# Patient Record
Sex: Female | Born: 1970 | Race: White | Hispanic: No | Marital: Married | Smoking: Never smoker
Health system: Southern US, Community
[De-identification: ages and names within clinical notes are randomized; demographics above are authoritative.]

## PROBLEM LIST (undated history)

## (undated) DIAGNOSIS — R519 Headache, unspecified: Secondary | ICD-10-CM

## (undated) DIAGNOSIS — R51 Headache: Secondary | ICD-10-CM

## (undated) DIAGNOSIS — K219 Gastro-esophageal reflux disease without esophagitis: Secondary | ICD-10-CM

## (undated) DIAGNOSIS — R002 Palpitations: Secondary | ICD-10-CM

## (undated) DIAGNOSIS — Z8489 Family history of other specified conditions: Secondary | ICD-10-CM

## (undated) DIAGNOSIS — Z9889 Other specified postprocedural states: Secondary | ICD-10-CM

## (undated) DIAGNOSIS — F900 Attention-deficit hyperactivity disorder, predominantly inattentive type: Secondary | ICD-10-CM

## (undated) DIAGNOSIS — R112 Nausea with vomiting, unspecified: Secondary | ICD-10-CM

## (undated) HISTORY — PX: ESOPHAGOGASTRODUODENOSCOPY: SHX1529

## (undated) HISTORY — PX: TONSILLECTOMY: SUR1361

## (undated) HISTORY — PX: ABDOMINAL HYSTERECTOMY: SHX81

## (undated) HISTORY — PX: LASIK: SHX215

## (undated) HISTORY — PX: FOOT SURGERY: SHX648

---

## 2005-05-28 ENCOUNTER — Observation Stay: Payer: Self-pay | Admitting: Obstetrics and Gynecology

## 2005-06-16 ENCOUNTER — Ambulatory Visit: Payer: Self-pay | Admitting: Certified Nurse Midwife

## 2005-06-17 ENCOUNTER — Ambulatory Visit: Payer: Self-pay | Admitting: Obstetrics and Gynecology

## 2005-06-18 ENCOUNTER — Observation Stay: Payer: Self-pay

## 2005-08-07 ENCOUNTER — Inpatient Hospital Stay: Payer: Self-pay | Admitting: Obstetrics and Gynecology

## 2006-11-17 ENCOUNTER — Ambulatory Visit: Payer: Self-pay | Admitting: Obstetrics and Gynecology

## 2006-11-24 ENCOUNTER — Ambulatory Visit: Payer: Self-pay | Admitting: Obstetrics and Gynecology

## 2006-12-06 HISTORY — PX: NASAL SINUS SURGERY: SHX719

## 2007-05-09 ENCOUNTER — Ambulatory Visit: Payer: Self-pay | Admitting: Otolaryngology

## 2007-05-09 ENCOUNTER — Other Ambulatory Visit: Payer: Self-pay

## 2007-05-18 ENCOUNTER — Ambulatory Visit: Payer: Self-pay | Admitting: Otolaryngology

## 2007-05-29 ENCOUNTER — Ambulatory Visit: Payer: Self-pay | Admitting: Obstetrics and Gynecology

## 2010-08-05 ENCOUNTER — Ambulatory Visit: Payer: Self-pay | Admitting: Otolaryngology

## 2011-12-07 HISTORY — PX: BREAST BIOPSY: SHX20

## 2011-12-09 ENCOUNTER — Ambulatory Visit: Payer: Self-pay | Admitting: Obstetrics and Gynecology

## 2011-12-20 ENCOUNTER — Ambulatory Visit: Payer: Self-pay | Admitting: Obstetrics and Gynecology

## 2012-01-18 ENCOUNTER — Ambulatory Visit: Payer: Self-pay | Admitting: Surgery

## 2012-01-20 LAB — PATHOLOGY REPORT

## 2012-03-20 ENCOUNTER — Ambulatory Visit: Payer: Self-pay

## 2013-01-10 ENCOUNTER — Ambulatory Visit: Payer: Self-pay | Admitting: Obstetrics and Gynecology

## 2014-01-15 ENCOUNTER — Ambulatory Visit: Payer: Self-pay | Admitting: Obstetrics and Gynecology

## 2014-01-25 ENCOUNTER — Ambulatory Visit: Payer: Self-pay | Admitting: Gastroenterology

## 2014-02-10 ENCOUNTER — Ambulatory Visit: Payer: Self-pay | Admitting: Internal Medicine

## 2014-02-10 LAB — RAPID STREP-A WITH REFLX: MICRO TEXT REPORT: NEGATIVE

## 2014-02-12 LAB — BETA STREP CULTURE(ARMC)

## 2015-01-24 ENCOUNTER — Ambulatory Visit: Payer: Self-pay | Admitting: Obstetrics and Gynecology

## 2015-08-08 IMAGING — NM NUCLEAR MEDICINE HEPATOHBILIARY INCLUDE GB
2 series · 15 of 15 positions shown · non-contrast
Comparison: No comparisons

RADIOPHARMACEUTICALS:  8.25 5TiVc-HHm Choletec

CLINICAL DATA: Right upper quadrant abdominal pain, nausea,
diarrhea

EXAM:
NUCLEAR MEDICINE HEPATOBILIARY IMAGING WITH GALLBLADDER EF
TECHNIQUE: Sequential images of the abdomen were obtained [DATE] minutes
following intravenous administration of radiopharmaceutical. After
slow intravenous infusion of 1.39 micrograms Cholecystokinin,
gallbladder ejection fraction was determined.

[Series 1000: gallbladder ef · 4.80mm/px · 6 of 120 frames shown]
[frame 11/120]
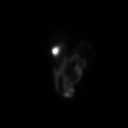
[frame 31/120]
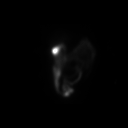
[frame 51/120]
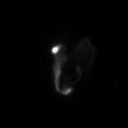
[frame 71/120]
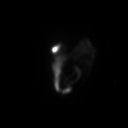
[frame 91/120]
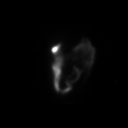
[frame 111/120]
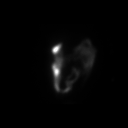

[Series 1000: gallbladder statics · 4.80mm/px · 9 of 9 slices shown]
[im 1/9]
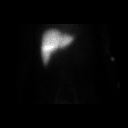
[im 2/9]
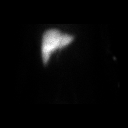
[im 3/9]
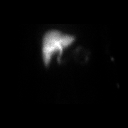
[im 4/9]
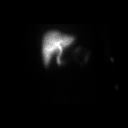
[im 5/9]
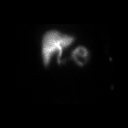
[im 6/9]
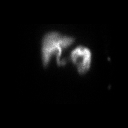
[im 7/9]
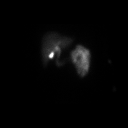
[im 8/9]
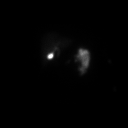
[im 9/9]
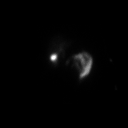

[15 of 15 positions shown; findings below may reference images not displayed]

FINDINGS: There is homogeneous distribution of injected radiotracer throughout
the hepatic parenchyma. There is early filling of the gallbladder,
initially seen on the 20 min anterior projection planar image.

There is early excretion of radiotracer with opacification of the
proximal small bowel, initially seen on the 10 min anterior
projection plantar image.

There is borderline delayed emptying of the gallbladder with
estimated gallbladder ejection fraction of only 36%. (Normal
gallbladder ejection fraction is greater than 35%).

The patient did not experience symptoms during CCK infusion.
IMPRESSION: 1. No scintigraphic evidence of acute cholecystitis.
2. Indeterminate findings for biliary dyskinesia with borderline
decreased gallbladder ejection fraction though the patient did not
experience abdominal pain during the CCK administration.

## 2016-01-16 ENCOUNTER — Other Ambulatory Visit: Payer: Self-pay | Admitting: Obstetrics and Gynecology

## 2016-01-16 DIAGNOSIS — Z1231 Encounter for screening mammogram for malignant neoplasm of breast: Secondary | ICD-10-CM

## 2016-01-16 DIAGNOSIS — F909 Attention-deficit hyperactivity disorder, unspecified type: Secondary | ICD-10-CM | POA: Insufficient documentation

## 2016-01-27 ENCOUNTER — Ambulatory Visit
Admission: RE | Admit: 2016-01-27 | Discharge: 2016-01-27 | Disposition: A | Discharge status: Home / Self Care | Payer: BC Managed Care – PPO | Source: Ambulatory Visit | Attending: Obstetrics and Gynecology | Admitting: Obstetrics and Gynecology

## 2016-01-27 DIAGNOSIS — Z1231 Encounter for screening mammogram for malignant neoplasm of breast: Secondary | ICD-10-CM | POA: Insufficient documentation

## 2016-01-29 DIAGNOSIS — R002 Palpitations: Secondary | ICD-10-CM | POA: Insufficient documentation

## 2016-01-29 DIAGNOSIS — R42 Dizziness and giddiness: Secondary | ICD-10-CM | POA: Insufficient documentation

## 2016-12-31 ENCOUNTER — Other Ambulatory Visit: Payer: Self-pay | Admitting: Obstetrics and Gynecology

## 2016-12-31 DIAGNOSIS — Z1231 Encounter for screening mammogram for malignant neoplasm of breast: Secondary | ICD-10-CM

## 2017-01-31 ENCOUNTER — Ambulatory Visit
Admission: RE | Admit: 2017-01-31 | Discharge: 2017-01-31 | Disposition: A | Discharge status: Home / Self Care | Payer: BC Managed Care – PPO | Source: Ambulatory Visit | Attending: Obstetrics and Gynecology | Admitting: Obstetrics and Gynecology

## 2017-01-31 DIAGNOSIS — Z1231 Encounter for screening mammogram for malignant neoplasm of breast: Secondary | ICD-10-CM | POA: Diagnosis not present

## 2017-02-14 ENCOUNTER — Other Ambulatory Visit: Payer: Self-pay | Admitting: Internal Medicine

## 2017-02-14 DIAGNOSIS — R1084 Generalized abdominal pain: Secondary | ICD-10-CM

## 2017-03-28 ENCOUNTER — Ambulatory Visit: Payer: BC Managed Care – PPO

## 2017-03-29 ENCOUNTER — Ambulatory Visit
Admission: RE | Admit: 2017-03-29 | Discharge: 2017-03-29 | Disposition: A | Discharge status: Home / Self Care | Payer: BC Managed Care – PPO | Source: Ambulatory Visit | Attending: Internal Medicine | Admitting: Internal Medicine

## 2017-03-29 DIAGNOSIS — R1084 Generalized abdominal pain: Secondary | ICD-10-CM | POA: Diagnosis present

## 2017-03-30 ENCOUNTER — Other Ambulatory Visit: Payer: Self-pay | Admitting: Internal Medicine

## 2017-03-30 DIAGNOSIS — R1011 Right upper quadrant pain: Secondary | ICD-10-CM

## 2017-04-12 ENCOUNTER — Ambulatory Visit
Admission: RE | Admit: 2017-04-12 | Discharge: 2017-04-12 | Disposition: A | Discharge status: Home / Self Care | Payer: BC Managed Care – PPO | Source: Ambulatory Visit | Attending: Internal Medicine | Admitting: Internal Medicine

## 2017-04-12 DIAGNOSIS — R1011 Right upper quadrant pain: Secondary | ICD-10-CM | POA: Insufficient documentation

## 2017-04-12 MED ORDER — TECHNETIUM TC 99M MEBROFENIN IV KIT
4.9100 | PACK | Freq: Once | INTRAVENOUS | Status: AC | PRN
  Administered 2017-04-12: 4.91 via INTRAVENOUS

## 2017-05-31 NOTE — H&P (Addendum)
HPI: AUB- Fibroids  Unresponsive to IUD and OCPs- i removed IUD in 2/17 for pain and cramping-vasectomy.  - Planned SIS aborted, because of thin stripe with hx of vasovagal sx with IUD placement and TVUS with fibroids.  Fibroids appear to be submucosal- no ablation (would need to be in OR, because so uncomfortable)  Ut anteverted, measuring 8.5x6x6cm  Fibroids seen: 1) post 39mm 2) mid 30mm  LOV simple cyst=2.34cm  ROV appears wnl ES 69mm  EMBx: ENDOMETRIUM, BIOPSY:  INACTIVE ENDOMETRIUM. NO HYPERPLASIA OR CARCINOMA.  Pap 2/16:  DIAGNOSIS: - [facility] Comment   Final  NEGATIVE FOR INTRAEPITHELIAL LESION AND MALIGNANCY.    Past Medical History:  has a past medical history of Cervicalgia; Headache; History of chicken pox; Lumbago; and Rotator cuff syndrome.  Past Surgical History:  has a past surgical history that includes Laser eye surgery (2008); Sinus surgery (2008); Foot surgery (2001); Tonsillectomy (1990); and Wisdom teeth removal (1989). Family History: family history includes Breast cancer in her maternal grandmother; Epilepsy in her mother; High blood pressure (Hypertension) in her mother; Irritable bowel syndrome in her mother; Lung cancer in her father. Social History:  reports that she has never smoked. She has never used smokeless tobacco. She reports that she drinks alcohol. She reports that she does not use drugs. OB/GYN History:          OB History    Gravida Para Term Preterm AB Living   3 2 1 1 1 2    SAB TAB Ectopic Molar Multiple Live Births     1       2      Obstetric Comments   G2 died of SIDS in 10-16-04 - pt has one living child      Allergies: is allergic to ceftin [cefuroxime axetil]; sulfa (sulfonamide antibiotics); and tetracycline. Medications:  Current Outpatient Prescriptions:  .  dextroamphetamine-amphetamine (ADDERALL) 10 mg tablet, Take 10 mg by mouth 2 (two) times daily., Disp: , Rfl:  .   MULTIVITAMIN ORAL, Take by mouth., Disp: , Rfl:  .  norethindrone-e.estradiol-iron (LO LOESTRIN FE) 1 mg-10 mcg (24)/10 mcg (2), Take 1 tablet by mouth once daily., Disp: 28 tablet, Rfl: 0 .  GLUCOSAMINE SULFATE (GLUCOSAMINE ORAL), Take by mouth., Disp: , Rfl:  .  LACTOBACILLUS ACIDOPHILUS (PROBIOTIC ORAL), Take by mouth., Disp: , Rfl:  .  mometasone (NASONEX) 50 mcg/actuation nasal spray, Place 2 sprays into both nostrils once daily as needed., Disp: , Rfl:   Review of Systems: No SOB, no palpitations or chest pain, no new lower extremity edema, no nausea or vomiting or bowel or bladder complaints. See HPI for gyn specific ROS.   Exam:   BP 123/82   Pulse 99   Ht 162.6 cm (5\' 4" )   Wt 75.3 kg (166 lb)   LMP 04/26/2017 (Approximate)   BMI 28.49 kg/m   General: Patient is well-groomed, well-nourished, appears stated age in no acute distress  HEENT: head is atraumatic and normocephalic, trachea is midline, neck is supple with no palpable nodules  CV: Regular rhythm and normal heart rate, no murmur  Pulm: Clear to auscultation throughout lung fields with no wheezing, crackles, or rhonchi. No increased work of breathing  Abdomen: soft , no mass, non-tender, no rebound tenderness, no hepatomegaly  Pelvic: deferred today   Impression:   The primary encounter diagnosis was Excessive or frequent menstruation. A diagnosis of Intramural and submucous leiomyoma of uterus was also pertinent to this visit.    Plan:  Patient returns for a preoperative discussion regarding her plans to proceed with definitive surgical treatment of her fibroid uterus causing AUB by  total vaginal hysterectomy with bilateral salpingectomy, and possible cystoscopy.  She has rash/hive allergy to sulfa, PCN and the cyclines. We will trial gent/clinda for ppx abx.  The patient and I discussed the technical aspects of the procedure including the potential for risks and complications. These  include but are not limited to the risk of infection requiring post-operative antibiotics or further procedures. We talked about the risk of injury to adjacent organs including bladder, bowel, ureter, blood vessels or nerves. We talked about the need to convert to a laparoscopy or an open incision. We talked about the possible need for blood transfusion. We talked aboutpostop complications such asthromboembolic or cardiopulmonary complications. All of her questions were answered.  Her preoperative exam was completed and the appropriate consents were signed. She is scheduled to undergo this procedure in the near future.

## 2017-06-01 ENCOUNTER — Other Ambulatory Visit: Payer: BC Managed Care – PPO

## 2017-06-02 ENCOUNTER — Encounter
Admission: RE | Admit: 2017-06-02 | Discharge: 2017-06-02 | Disposition: A | Discharge status: Home / Self Care | Payer: BC Managed Care – PPO | Source: Ambulatory Visit | Attending: Obstetrics and Gynecology | Admitting: Obstetrics and Gynecology

## 2017-06-02 HISTORY — DX: Headache, unspecified: R51.9

## 2017-06-02 HISTORY — DX: Palpitations: R00.2

## 2017-06-02 HISTORY — DX: Headache: R51

## 2017-06-02 HISTORY — DX: Gastro-esophageal reflux disease without esophagitis: K21.9

## 2017-06-02 NOTE — Pre-Procedure Instructions (Signed)
Progress Notes - in this encounter  Flossie Dibble, MD - 01/29/2016 11:30 AM EST Formatting of this note may be different from the original. New Patient Visit   Chief Complaint: Chief Complaint  Patient presents with  . New Consultation  palpitations  . Dizziness  . Palpitations  Date of Service: 01/29/2016 Date of Birth: 05-Jan-1971 PCP: Idelle Crouch, MD, MD  History of Present Illness: Ms. Magos is a 46 y.o.female patient Palpitations The patient has a new problem of palpitations over the last 4 days worsening with increased frequency occurring intermittently associated with dizziness. They may be relived by nothing. Possible causes include Probably benign palpitations, not indicative of a serious arrhythmia  Dizziness The patient has had Acute dizziness over the last 5 days associated with moving from lying to sitting position and moving from sitting to standing position with variable relief. These symptoms appear to be worsening with increased frequency. Other symptoms include dimming visionLightheaded. Possible causes include unknown  Stress Test Regular Stress was performed showing Normal test.  Past Medical and Surgical History  Past Medical History Past Medical History  Diagnosis Date  . Cervicalgia  . Headache  . History of chicken pox  . Lumbago  . Rotator cuff syndrome   Past Surgical History She has a past surgical history that includes Laser eye surgery (2008); Sinus surgery (2008); Foot surgery (2001); Tonsillectomy (1990); and Wisdom teeth removal (1989).   Medications and Allergies  Current Medications  Current Outpatient Prescriptions  Medication Sig Dispense Refill  . [START ON 03/15/2016] dextroamphetamine-amphetamine (ADDERALL) 10 mg tablet Take 1 tablet (10 mg total) by mouth 2 (two) times daily. Earliest Fill Date: 03/15/16 60 tablet 0  . GLUCOSAMINE SULFATE (GLUCOSAMINE ORAL) Take by mouth.  Marland Kitchen LACTOBACILLUS ACIDOPHILUS (PROBIOTIC ORAL) Take by  mouth.  . mometasone (NASONEX) 50 mcg/actuation nasal spray Place 2 sprays into both nostrils once daily as needed.  . MULTIVITAMIN ORAL Take by mouth.   No current facility-administered medications for this visit.   Allergies: Ceftin [cefuroxime axetil]; Sulfa (sulfonamide antibiotics); and Tetracycline  Social and Family History  Social History reports that she has never smoked. She has never used smokeless tobacco. She reports that she drinks alcohol. She reports that she does not use illicit drugs.  Family History Family History  Problem Relation Age of Onset  . Irritable bowel syndrome Mother  did need her gallbladder removed  . Hypertension Mother  . Epilepsy Mother  . Lung cancer Father  . Breast cancer Maternal Grandmother   Review of Systems  Positive for palps dizziness Review of Systems:negative for weight gain, wieght loss, weakness, fatigue, vision change, hearing loss, cough, congestion, PND, orthopnea, heartburn, nausea, vomiting, diarrhea, bloody stools, melena, stomach pain, leg weakness, leg pain, leg blood clots leg cramping, headache, blackouts, nosebleed, trouble swallowing, frequent urination, urination at night, skin rashes, skin lesions, muscle weakness, numbness, tingling, anxiety, depression  Physical Examination   Vitals: Visit Vitals  . BP 122/78  . Pulse 91  . Resp 14  . Ht 165.1 cm (5\' 5" )  . Wt 71.7 kg (158 lb)  . BMI 26.29 kg/m2   Ht:165.1 cm (5\' 5" ) Wt:71.7 kg (158 lb) OEV:OJJK surface area is 1.81 meters squared. Body mass index is 26.29 kg/(m^2). Appearance: well appearing in no acute distress HEENT: Pupils equally reactive to light and accomodation no apparent xantholasma or other apparent lesions  Neck: Supple without masses or lymphadenopathy Lungs: normal respiratory effort; no wheezes, no crackles, no rhonchi Heart: Regular  rate and rhythm. Normal S1 S2 No gallops, murmurs, no rub, PMI is normal size and placement. carotid upstroke  normal without bruit. Jugular venous pressure is normal Abdomen: soft, nontender, non distended, with normal bowel sounds. Abdominal aorta is normal size without bruit. No apparent masses Extremities: No edema, no cyanosis, no clubbing, no ulcers Peripheral Pulses: 2+ in all extremities, 2+ femoral pulses bilaterally, 2+dp pulses Musculoskeletal; Normal muscle tone  Neurological: Cranial nerves intact, Oriented to time, place, and person  Assessment   46 y.o. female with  Encounter Diagnoses  Name Primary?  . Dizziness and giddiness Yes  . Palpitations   Plan  -No further intervention of preventricular contractions which appear to be benign in nature. Continue diet and exercise as well as treatment of lifestyle actions which may help above symptoms. Other consideration of medical management as needed if patient has significant symptoms in the future. -No further cardiac intervention at this time due to normal stress test without evidence of myocardial ischemia or chest pain at peak stress. Patient will watch for any recurrance of concerning symptoms and report them for further evaluation and treatment options. -The dizziness appears to be multifactorial in nature at this time and therefore we plan for further observation. No intervention is needed at this time. We will follow for other cardiovascular symptoms with continued ambulation  No orders of the defined types were placed in this encounter.  Return if symptoms worsen or fail to improve.  Flossie Dibble, MD    Plan of Treatment - as of this encounter   Upcoming Encounters Upcoming Encounters  Date Type Specialty Care Team Description  07/13/2017 Procedure visit Plastic Surgery Wyline Copas, MD  [address] [place], [place] [address]  [phone]  [phone] ([address]    Tish Men    08/09/2017 Initial consult Dermatology Frederick Peers, MD  [address]  [place], [place] [address]  [phone]  [phone] (Fax)     Visit Diagnoses    Diagnosis  Dizziness and giddiness - Primary  Palpitations   Discontinued Medications - as of this encounter   Prescription Sig. Discontinue Reason Start Date End Date  dextroamphetamine-amphetamine (ADDERALL) 10 mg tablet  Indications: Adult ADHD Take 1 tablet (10 mg total) by mouth 2 (two) times daily.  01/16/2016 01/29/2016  dextroamphetamine-amphetamine (ADDERALL) 10 mg tablet  Indications: Adult ADHD Take 1 tablet (10 mg total) by mouth 2 (two) times daily. Earliest Fill Date: 02/13/16  02/13/2016 01/29/2016   Images Document Information   Primary Care Provider Idelle Crouch MD (Feb. 10, 2017 - Present) [phone] (Work) [phone] (Fax) [facility] [place], [place] [address]  Document Coverage Dates Feb. 23, 2017  [place], [place] [address]   Encounter Providers Flossie Dibble MD (Attending) [phone] (Work) [phone] (Fax) [place] [facility] [place], [place] [address]   Encounter Date Feb. 23, 2017

## 2017-06-02 NOTE — Pre-Procedure Instructions (Signed)
ECG 12-lead3/09/2015 [place] Component Name Value Ref Range  Vent Rate (bpm) 73   PR Interval (msec) 134   QRS Interval (msec) 80   QT Interval (msec) 386   QTc (msec) 425   Result Narrative  Normal sinus rhythm Normal ECG No previous ECGs available I reviewed and concur with this report. Electronically signed RE:QJEADG MD, JEFFREY ([id number]) on 04/04/2015 8:04:50 AM  Status Results Details    Initial consult on 02/13/2015 [place]")' href="[email or web address]/">Encounter Summary

## 2017-06-02 NOTE — Patient Instructions (Signed)
  Your procedure is scheduled on: 06-20-17 MONDAY Report to Same Day Surgery 2nd floor medical mall [facility] Entrance-take elevator on left to 2nd floor.  Check in with surgery information desk.) To find out your arrival time please call [phone] between 1PM - 3PM on 06-17-17 FRIDAY  Remember: Instructions that are not followed completely may result in serious medical risk, up to and including death, or upon the discretion of your surgeon and anesthesiologist your surgery may need to be rescheduled.    _x___ 1. Do not eat food or drink liquids after midnight. No gum chewing or hard candies.     __x__ 2. No Alcohol for 24 hours before or after surgery.   __x__3. No Smoking for 24 prior to surgery.   ____  4. Bring all medications with you on the day of surgery if instructed.    __x__ 5. Notify your doctor if there is any change in your medical condition     (cold, fever, infections).     Do not wear jewelry, make-up, hairpins, clips or nail polish.  Do not wear lotions, powders, or perfumes. You may wear deodorant.  Do not shave 48 hours prior to surgery. Men may shave face and neck.  Do not bring valuables to the hospital.    [facility] is not responsible for any belongings or valuables.               Contacts, dentures or bridgework may not be worn into surgery.  Leave your suitcase in the car. After surgery it may be brought to your room.  For patients admitted to the hospital, discharge time is determined by your  treatment team.   Patients discharged the day of surgery will not be allowed to drive home.  You will need someone to drive you home and stay with you the night of your procedure.    Please read over the following fact sheets that you were given:     ____ Take anti-hypertensive (unless it includes a diuretic), cardiac, seizure, asthma,     anti-reflux and psychiatric medicines. These include:  1. NONE  2.  3.  4.  5.  6.  ____Fleets enema or Magnesium  Citrate as directed.   ____ Use CHG Soap or sage wipes as directed on instruction sheet   ____ Use inhalers on the day of surgery and bring to hospital day of surgery  ____ Stop Metformin and Janumet 2 days prior to surgery.    ____ Take 1/2 of usual insulin dose the night before surgery and none on the morning surgery.   ____ Follow recommendations from Cardiologist, Pulmonologist or PCP regarding stopping Aspirin, Coumadin, Pllavix ,Eliquis, Effient, or Pradaxa, and Pletal.  X____Stop Anti-inflammatories such as Advil, Aleve, Ibuprofen, Motrin, Naproxen, Naprosyn, Goodies powders or aspirin products NOW-OK to take Tylenol    ____ Stop supplements until after surgery.     ____ Bring C-Pap to the hospital.

## 2017-06-13 ENCOUNTER — Encounter
Admission: RE | Admit: 2017-06-13 | Discharge: 2017-06-13 | Disposition: A | Discharge status: Home / Self Care | Payer: BC Managed Care – PPO | Source: Ambulatory Visit | Attending: Obstetrics and Gynecology | Admitting: Obstetrics and Gynecology

## 2017-06-13 DIAGNOSIS — N939 Abnormal uterine and vaginal bleeding, unspecified: Secondary | ICD-10-CM | POA: Insufficient documentation

## 2017-06-13 DIAGNOSIS — Z01818 Encounter for other preprocedural examination: Secondary | ICD-10-CM | POA: Diagnosis present

## 2017-06-13 LAB — BASIC METABOLIC PANEL
Anion gap: 9 (ref 5–15)
BUN: 12 mg/dL (ref 6–20)
CHLORIDE: 107 mmol/L (ref 101–111)
CO2: 25 mmol/L (ref 22–32)
CREATININE: 0.68 mg/dL (ref 0.44–1.00)
Calcium: 8.9 mg/dL (ref 8.9–10.3)
GFR calc Af Amer: >60 mL/min (ref >60)
GFR calc non Af Amer: >60 mL/min (ref >60)
Glucose, Bld: 90 mg/dL (ref 65–99)
Potassium: 3.6 mmol/L (ref 3.5–5.1)
SODIUM: 141 mmol/L (ref 135–145)

## 2017-06-13 LAB — CBC
HCT: 40.9 % (ref 35.0–47.0)
Hemoglobin: 14 g/dL (ref 12.0–16.0)
MCH: 32 pg (ref 26.0–34.0)
MCHC: 34.2 g/dL (ref 32.0–36.0)
MCV: 93.4 fL (ref 80.0–100.0)
PLATELETS: 244 10*3/uL (ref 150–440)
RBC: 4.38 MIL/uL (ref 3.80–5.20)
RDW: 12.4 % (ref 11.5–14.5)
WBC: 8.4 10*3/uL (ref 3.6–11.0)

## 2017-06-13 LAB — TYPE AND SCREEN
ABO/RH(D): A POS
Antibody Screen: NEGATIVE

## 2017-06-19 MED ORDER — GENTAMICIN SULFATE 40 MG/ML IJ SOLN: INTRAMUSCULAR | Status: DC

## 2017-06-20 ENCOUNTER — Encounter
Admission: RE | Disposition: A | Discharge status: Home / Self Care | Payer: Self-pay | Source: Ambulatory Visit | Attending: Obstetrics and Gynecology

## 2017-06-20 ENCOUNTER — Encounter: Payer: Self-pay | Admitting: *Deleted

## 2017-06-20 ENCOUNTER — Ambulatory Visit
Admission: RE | Admit: 2017-06-20 | Discharge: 2017-06-20 | Disposition: A | Discharge status: Home / Self Care | Payer: BC Managed Care – PPO | Source: Ambulatory Visit | Attending: Obstetrics and Gynecology | Admitting: Obstetrics and Gynecology

## 2017-06-20 ENCOUNTER — Ambulatory Visit: Payer: BC Managed Care – PPO | Admitting: Anesthesiology

## 2017-06-20 DIAGNOSIS — D251 Intramural leiomyoma of uterus: Secondary | ICD-10-CM | POA: Insufficient documentation

## 2017-06-20 DIAGNOSIS — Z79899 Other long term (current) drug therapy: Secondary | ICD-10-CM | POA: Insufficient documentation

## 2017-06-20 DIAGNOSIS — N920 Excessive and frequent menstruation with regular cycle: Secondary | ICD-10-CM | POA: Diagnosis present

## 2017-06-20 DIAGNOSIS — N72 Inflammatory disease of cervix uteri: Secondary | ICD-10-CM | POA: Diagnosis present

## 2017-06-20 DIAGNOSIS — N838 Other noninflammatory disorders of ovary, fallopian tube and broad ligament: Secondary | ICD-10-CM | POA: Diagnosis not present

## 2017-06-20 DIAGNOSIS — K219 Gastro-esophageal reflux disease without esophagitis: Secondary | ICD-10-CM | POA: Diagnosis not present

## 2017-06-20 HISTORY — PX: VAGINAL HYSTERECTOMY: SHX2639

## 2017-06-20 HISTORY — PX: BILATERAL SALPINGECTOMY: SHX5743

## 2017-06-20 LAB — ABO/RH: ABO/RH(D): A POS

## 2017-06-20 LAB — POCT PREGNANCY, URINE: Preg Test, Ur: NEGATIVE

## 2017-06-20 SURGERY — HYSTERECTOMY, VAGINAL
Anesthesia: General | Wound class: Clean Contaminated

## 2017-06-20 MED ORDER — LIDOCAINE-EPINEPHRINE 1 %-1:100000 IJ SOLN
INTRAMUSCULAR | Status: AC
  Filled 2017-06-20: qty 1

## 2017-06-20 MED ORDER — PROPOFOL 10 MG/ML IV BOLUS
INTRAVENOUS | Status: DC | PRN
  Administered 2017-06-20: 150 mg via INTRAVENOUS

## 2017-06-20 MED ORDER — DEXAMETHASONE SODIUM PHOSPHATE 10 MG/ML IJ SOLN
INTRAMUSCULAR | Status: DC | PRN
  Administered 2017-06-20: 5 mg via INTRAVENOUS

## 2017-06-20 MED ORDER — OXYCODONE HCL 5 MG PO CAPS: 5.0000 mg | ORAL_CAPSULE | Freq: Four times a day (QID) | ORAL | 0 refills | Status: DC | PRN

## 2017-06-20 MED ORDER — PHENYLEPHRINE HCL 10 MG/ML IJ SOLN
INTRAMUSCULAR | Status: AC
  Filled 2017-06-20: qty 1

## 2017-06-20 MED ORDER — DOCUSATE SODIUM 100 MG PO CAPS: 100.0000 mg | ORAL_CAPSULE | Freq: Every day | ORAL | 0 refills | Status: DC | PRN

## 2017-06-20 MED ORDER — ACETAMINOPHEN 500 MG PO TABS
ORAL_TABLET | ORAL | Status: AC
  Filled 2017-06-20: qty 2

## 2017-06-20 MED ORDER — FENTANYL CITRATE (PF) 100 MCG/2ML IJ SOLN: 25.0000 ug | INTRAMUSCULAR | Status: DC | PRN

## 2017-06-20 MED ORDER — GABAPENTIN 300 MG PO CAPS
900.0000 mg | ORAL_CAPSULE | ORAL | Status: AC
  Administered 2017-06-20: 900 mg via ORAL

## 2017-06-20 MED ORDER — OXYCODONE HCL 5 MG/5ML PO SOLN: 5.0000 mg | Freq: Once | ORAL | Status: AC | PRN

## 2017-06-20 MED ORDER — MEPERIDINE HCL 50 MG/ML IJ SOLN: 6.2500 mg | INTRAMUSCULAR | Status: DC | PRN

## 2017-06-20 MED ORDER — ONDANSETRON 4 MG PO TBDP
4.0000 mg | ORAL_TABLET | Freq: Four times a day (QID) | ORAL | Status: DC | PRN
  Administered 2017-06-20: 4 mg via ORAL
  Filled 2017-06-20: qty 1

## 2017-06-20 MED ORDER — KETOROLAC TROMETHAMINE 30 MG/ML IJ SOLN
INTRAMUSCULAR | Status: AC
  Filled 2017-06-20: qty 1

## 2017-06-20 MED ORDER — LIDOCAINE HCL (CARDIAC) 20 MG/ML IV SOLN
INTRAVENOUS | Status: DC | PRN
  Administered 2017-06-20: 60 mg via INTRAVENOUS

## 2017-06-20 MED ORDER — PROPOFOL 10 MG/ML IV BOLUS
INTRAVENOUS | Status: AC
  Filled 2017-06-20: qty 20

## 2017-06-20 MED ORDER — LACTATED RINGERS IV SOLN: INTRAVENOUS | Status: DC

## 2017-06-20 MED ORDER — SUGAMMADEX SODIUM 200 MG/2ML IV SOLN
INTRAVENOUS | Status: DC | PRN
  Administered 2017-06-20: 150 mg via INTRAVENOUS

## 2017-06-20 MED ORDER — DEXAMETHASONE SODIUM PHOSPHATE 10 MG/ML IJ SOLN
INTRAMUSCULAR | Status: AC
  Filled 2017-06-20: qty 1

## 2017-06-20 MED ORDER — ONDANSETRON HCL 4 MG/2ML IJ SOLN
INTRAMUSCULAR | Status: DC | PRN
  Administered 2017-06-20: 4 mg via INTRAVENOUS

## 2017-06-20 MED ORDER — ROCURONIUM BROMIDE 50 MG/5ML IV SOLN
INTRAVENOUS | Status: AC
  Filled 2017-06-20: qty 2

## 2017-06-20 MED ORDER — ONDANSETRON 4 MG PO TBDP: 4.0000 mg | ORAL_TABLET | Freq: Four times a day (QID) | ORAL | 0 refills | Status: DC | PRN

## 2017-06-20 MED ORDER — EPHEDRINE SULFATE 50 MG/ML IJ SOLN
INTRAMUSCULAR | Status: DC | PRN
  Administered 2017-06-20: 5 mg via INTRAVENOUS
  Administered 2017-06-20: 10 mg via INTRAVENOUS

## 2017-06-20 MED ORDER — LACTATED RINGERS IV SOLN
INTRAVENOUS | Status: DC
  Administered 2017-06-20: 07:00:00 via INTRAVENOUS

## 2017-06-20 MED ORDER — OXYCODONE HCL 5 MG PO TABS
ORAL_TABLET | ORAL | Status: AC
  Filled 2017-06-20: qty 1

## 2017-06-20 MED ORDER — KETOROLAC TROMETHAMINE 30 MG/ML IJ SOLN
INTRAMUSCULAR | Status: DC | PRN
  Administered 2017-06-20: 30 mg via INTRAVENOUS

## 2017-06-20 MED ORDER — CLINDAMYCIN PHOSPHATE 900 MG/50ML IV SOLN
900.0000 mg | Freq: Once | INTRAVENOUS | Status: AC
  Administered 2017-06-20: 900 mg via INTRAVENOUS

## 2017-06-20 MED ORDER — IBUPROFEN 800 MG PO TABS: 800.0000 mg | ORAL_TABLET | Freq: Three times a day (TID) | ORAL | 1 refills | Status: DC | PRN

## 2017-06-20 MED ORDER — LIDOCAINE-EPINEPHRINE 1 %-1:100000 IJ SOLN
INTRAMUSCULAR | Status: DC | PRN
  Administered 2017-06-20: 19 mL

## 2017-06-20 MED ORDER — ACETAMINOPHEN 500 MG PO TABS
1000.0000 mg | ORAL_TABLET | ORAL | Status: AC
  Administered 2017-06-20: 1000 mg via ORAL

## 2017-06-20 MED ORDER — MIDAZOLAM HCL 2 MG/2ML IJ SOLN
INTRAMUSCULAR | Status: AC
  Filled 2017-06-20: qty 2

## 2017-06-20 MED ORDER — FAMOTIDINE 20 MG PO TABS: 20.0000 mg | ORAL_TABLET | Freq: Once | ORAL | Status: DC

## 2017-06-20 MED ORDER — FENTANYL CITRATE (PF) 100 MCG/2ML IJ SOLN
INTRAMUSCULAR | Status: DC | PRN
  Administered 2017-06-20: 100 ug via INTRAVENOUS
  Administered 2017-06-20: 25 ug via INTRAVENOUS

## 2017-06-20 MED ORDER — SUGAMMADEX SODIUM 200 MG/2ML IV SOLN
INTRAVENOUS | Status: AC
  Filled 2017-06-20: qty 2

## 2017-06-20 MED ORDER — PROMETHAZINE HCL 25 MG/ML IJ SOLN: 6.2500 mg | INTRAMUSCULAR | Status: DC | PRN

## 2017-06-20 MED ORDER — EPHEDRINE SULFATE 50 MG/ML IJ SOLN
INTRAMUSCULAR | Status: AC
Start: 2017-06-20 — End: 2017-06-20
  Filled 2017-06-20: qty 1

## 2017-06-20 MED ORDER — MIDAZOLAM HCL 2 MG/2ML IJ SOLN
INTRAMUSCULAR | Status: DC | PRN
  Administered 2017-06-20: 2 mg via INTRAVENOUS

## 2017-06-20 MED ORDER — GABAPENTIN 800 MG PO TABS: 800.0000 mg | ORAL_TABLET | Freq: Every day | ORAL | 0 refills | Status: DC

## 2017-06-20 MED ORDER — ONDANSETRON HCL 4 MG/2ML IJ SOLN
INTRAMUSCULAR | Status: AC
  Filled 2017-06-20: qty 2

## 2017-06-20 MED ORDER — ONDANSETRON HCL 4 MG PO TABS
ORAL_TABLET | ORAL | Status: AC
  Filled 2017-06-20: qty 1

## 2017-06-20 MED ORDER — FENTANYL CITRATE (PF) 250 MCG/5ML IJ SOLN
INTRAMUSCULAR | Status: AC
  Filled 2017-06-20: qty 5

## 2017-06-20 MED ORDER — GABAPENTIN 300 MG PO CAPS
ORAL_CAPSULE | ORAL | Status: AC
  Filled 2017-06-20: qty 3

## 2017-06-20 MED ORDER — CLINDAMYCIN PHOSPHATE 900 MG/50ML IV SOLN
INTRAVENOUS | Status: AC
  Filled 2017-06-20: qty 50

## 2017-06-20 MED ORDER — LIDOCAINE HCL (PF) 2 % IJ SOLN
INTRAMUSCULAR | Status: AC
  Filled 2017-06-20: qty 2

## 2017-06-20 MED ORDER — GENTAMICIN SULFATE 40 MG/ML IJ SOLN
5.0000 mg/kg | Freq: Once | INTRAVENOUS | Status: AC
  Administered 2017-06-20: 310 mg via INTRAVENOUS
  Filled 2017-06-20: qty 7.75

## 2017-06-20 MED ORDER — ROCURONIUM BROMIDE 100 MG/10ML IV SOLN
INTRAVENOUS | Status: DC | PRN
  Administered 2017-06-20: 50 mg via INTRAVENOUS
  Administered 2017-06-20: 10 mg via INTRAVENOUS

## 2017-06-20 MED ORDER — OXYCODONE HCL 5 MG PO TABS
5.0000 mg | ORAL_TABLET | Freq: Once | ORAL | Status: AC | PRN
  Administered 2017-06-20: 5 mg via ORAL

## 2017-06-20 MED ORDER — ACETAMINOPHEN 500 MG PO TABS: 1000.0000 mg | ORAL_TABLET | Freq: Four times a day (QID) | ORAL | 0 refills | Status: AC

## 2017-06-20 SURGICAL SUPPLY — 30 items
BAG URO DRAIN 2000ML W/SPOUT (MISCELLANEOUS) ×4 IMPLANT
CANISTER SUCT 1200ML W/VALVE (MISCELLANEOUS) ×4 IMPLANT
CATH FOLEY 2WAY  5CC 16FR (CATHETERS) ×2
CATH URTH 16FR FL 2W BLN LF (CATHETERS) ×2 IMPLANT
DRAPE PERI LITHO V/GYN (MISCELLANEOUS) ×4 IMPLANT
DRAPE SURG 17X11 SM STRL (DRAPES) ×4 IMPLANT
DRAPE UNDER BUTTOCK W/FLU (DRAPES) ×4 IMPLANT
ELECT REM PT RETURN 9FT ADLT (ELECTROSURGICAL) ×4
ELECTRODE REM PT RTRN 9FT ADLT (ELECTROSURGICAL) ×2 IMPLANT
GLOVE BIO SURGEON STRL SZ7 (GLOVE) ×4 IMPLANT
GLOVE INDICATOR 7.5 STRL GRN (GLOVE) ×4 IMPLANT
GOWN STRL REUS W/ TWL LRG LVL3 (GOWN DISPOSABLE) ×6 IMPLANT
GOWN STRL REUS W/ TWL XL LVL3 (GOWN DISPOSABLE) ×2 IMPLANT
GOWN STRL REUS W/TWL LRG LVL3 (GOWN DISPOSABLE) ×6
GOWN STRL REUS W/TWL XL LVL3 (GOWN DISPOSABLE) ×2
KIT RM TURNOVER CYSTO AR (KITS) ×4 IMPLANT
LABEL OR SOLS (LABEL) ×4 IMPLANT
NDL SAFETY 22GX1.5 (NEEDLE) ×4 IMPLANT
PACK BASIN MINOR ARMC (MISCELLANEOUS) ×4 IMPLANT
PAD OB MATERNITY 4.3X12.25 (PERSONAL CARE ITEMS) ×4 IMPLANT
PAD PREP 24X41 OB/GYN DISP (PERSONAL CARE ITEMS) ×4 IMPLANT
SUT PDS 2-0 27IN (SUTURE) ×4 IMPLANT
SUT VIC AB 0 CT1 27 (SUTURE) ×6
SUT VIC AB 0 CT1 27XCR 8 STRN (SUTURE) ×6 IMPLANT
SUT VIC AB 0 CT1 36 (SUTURE) ×8 IMPLANT
SUT VIC AB 2-0 SH 27 (SUTURE) ×6
SUT VIC AB 2-0 SH 27XBRD (SUTURE) ×6 IMPLANT
SYR CONTROL 10ML (SYRINGE) ×4 IMPLANT
SYRINGE 10CC LL (SYRINGE) ×4 IMPLANT
WATER STERILE IRR 1000ML POUR (IV SOLUTION) ×4 IMPLANT

## 2017-06-20 NOTE — Discharge Instructions (Signed)
Discharge instructions after  vaginal hysterectomy  Signs and Symptoms to Report Call our office at [phone] if you have any of the following.   Fever over 100.4 degrees or higher  Severe stomach pain not relieved with pain medications  Bright red bleeding thats heavier than a period that does not slow with rest  To go the bathroom a lot (frequency), you cant hold your urine (urgency), or it hurts when you empty your bladder (urinate)  Chest pain  Shortness of breath  Pain in the calves of your legs  Severe nausea and vomiting not relieved with anti-nausea medications  Signs of infection around your wounds, such as redness, hot to touch, swelling, green/yellow drainage (like pus), bad smelling discharge  Any concerns  What You Can Expect after Surgery  You may see some pink tinged, bloody fluid and bruising around the wound. This is normal.  You may have a sore throat because of the tube in your mouth during general anesthesia. This will go away in 2 to 3 days.  You may have some stomach cramps.  You may notice spotting on your panties.   Activities after Your Discharge Follow these guidelines to help speed your recovery at home:  Do the coughing and deep breathing as you did in the hospital for 2 weeks. Use the small blue breathing device, called the incentive spirometer for 2 weeks.  Dont drive if you are in pain or taking narcotic pain medicine. You may drive when you can safely slam on the brakes, turn the wheel forcefully, and rotate your torso comfortably. This is typically 1-2 weeks. Practice in a parking lot or side street prior to attempting to drive regularly.   Ask others to help with household chores for 4 weeks.  Do not lift anything heavier that 10 pounds for 4-6 weeks. This includes pets, children, and groceries.  Dont do strenuous activities, exercises, or sports like vacuuming, tennis, squash, etc. until your doctor says it is safe to do  so. ---Maintain pelvic rest for 8 weeks. This means nothing in the vagina at all (no douching, tampons, intercourse) for 8 weeks.   Walk as you feel able. Rest often since it may take two or three weeks for your energy level to return to normal.   You may climb stairs  Avoid constipation:   -Eat fruits, vegetables, and whole grains. Eat small meals as your appetite will take time to return to normal.   -Drink 6 to 8 glasses of water each day.   -Use a laxative or stool softener as needed if constipation becomes a problem. You may take Miralax, metamucil, Citrucil, Colace, Senekot, FiberCon, etc. If this does not relieve the constipation, try two tablespoons of Milk Of Magnesia every 8 hours until your bowels move.   You may shower. Gently wash the wounds with a mild soap and water. Pat dry.  Do not get in a hot tub, swimming pool, etc. for 6 weeks.  Do not use lotions, oils, powders on the wounds.  Do not douche, use tampons, or have sex until your doctor says it is okay.  Take your pain medicine when you need it. The medicine may not work as well if the pain is bad.  Take the medicines you were taking before surgery. Other medications you will need are pain medications (Norco or Percocet) and nausea medications (Zofran).  General Anesthesia, Adult, Care After These instructions provide you with information about caring for yourself after your procedure. Your  health care provider may also give you more specific instructions. Your treatment has been planned according to current medical practices, but problems sometimes occur. Call your health care provider if you have any problems or questions after your procedure. What can I expect after the procedure? After the procedure, it is common to have:  Vomiting.  A sore throat.  Mental slowness.  It is common to feel:  Nauseous.  Cold or shivery.  Sleepy.  Tired.  Sore or achy, even in parts of your body where you did not have  surgery.  Follow these instructions at home: For at least 24 hours after the procedure:  Do not: ? Participate in activities where you could fall or become injured. ? Drive. ? Use heavy machinery. ? Drink alcohol. ? Take sleeping pills or medicines that cause drowsiness. ? Make important decisions or sign legal documents. ? Take care of children on your own.  Rest. Eating and drinking  If you vomit, drink water, juice, or soup when you can drink without vomiting.  Drink enough fluid to keep your urine clear or pale yellow.  Make sure you have little or no nausea before eating solid foods.  Follow the diet recommended by your health care provider. General instructions  Have a responsible adult stay with you until you are awake and alert.  Return to your normal activities as told by your health care provider. Ask your health care provider what activities are safe for you.  Take over-the-counter and prescription medicines only as told by your health care provider.  If you smoke, do not smoke without supervision.  Keep all follow-up visits as told by your health care provider. This is important. Contact a health care provider if:  You continue to have nausea or vomiting at home, and medicines are not helpful.  You cannot drink fluids or start eating again.  You cannot urinate after 8-12 hours.  You develop a skin rash.  You have fever.  You have increasing redness at the site of your procedure. Get help right away if:  You have difficulty breathing.  You have chest pain.  You have unexpected bleeding.  You feel that you are having a life-threatening or urgent problem. This information is not intended to replace advice given to you by your health care provider. Make sure you discuss any questions you have with your health care provider. Document Released: 02/28/2001 Document Revised: 04/26/2016 Document Reviewed: 11/06/2015 Elsevier Interactive Patient Education   [employer].

## 2017-06-20 NOTE — Addendum Note (Signed)
Addendum  created 06/20/17 1034 by Letitia Neri, CRNA   Anesthesia Intra Flowsheets edited

## 2017-06-20 NOTE — Progress Notes (Signed)
Patient wanted it made known she had a massage And had the cupping done then also, so she has Some red, strawberry marks on her back and wanted To make [place] aware of this.  Dr. Leafy Ro also aware.

## 2017-06-20 NOTE — Anesthesia Postprocedure Evaluation (Signed)
Anesthesia Post Note  Patient: Hannah Robbins  Procedure(s) Performed: Procedure(s) (LRB): HYSTERECTOMY VAGINAL (N/A) BILATERAL SALPINGECTOMY (Bilateral)  Patient location during evaluation: PACU Anesthesia Type: General Level of consciousness: awake and alert and oriented Pain management: pain level controlled Vital Signs Assessment: post-procedure vital signs reviewed and stable Respiratory status: spontaneous breathing, nonlabored ventilation and respiratory function stable Cardiovascular status: blood pressure returned to baseline and stable Postop Assessment: no signs of nausea or vomiting Anesthetic complications: no     Last Vitals:  Vitals:   06/20/17 1008 06/20/17 1017  BP: 111/64 (!) 112/55  Pulse: 87 75  Resp: 17 12  Temp: 37 C 36.8 C    Last Pain:  Vitals:   06/20/17 1017  TempSrc: Temporal  PainSc: 1                  D'Arcy Abraha
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
## 2017-06-20 NOTE — Interval H&P Note (Signed)
History and Physical Interval Note:  06/20/2017 7:42 AM  Hannah Robbins  has presented today for surgery, with the diagnosis of AUB  Fibroids  The various methods of treatment have been discussed with the patient and family. After consideration of risks, benefits and other options for treatment, the patient has consented to  Procedure(s): HYSTERECTOMY VAGINAL (N/A) BILATERAL SALPINGECTOMY (Bilateral) as a surgical intervention .  The patient's history has been reviewed, patient examined, no change in status, stable for surgery.  I have reviewed the patient's chart and labs.  Questions were answered to the patient's satisfaction.     Benjaman Kindler

## 2017-06-20 NOTE — Anesthesia Preprocedure Evaluation (Signed)
Anesthesia Evaluation  Patient identified by MRN, date of birth, ID band Patient awake    Reviewed: Allergy & Precautions, NPO status , Patient's Chart, lab work & pertinent test results  History of Anesthesia Complications Negative for: history of anesthetic complications  Airway Mallampati: I  TM Distance: >3 FB Neck ROM: Full    Dental no notable dental hx.    Pulmonary neg pulmonary ROS, neg sleep apnea, neg COPD,    breath sounds clear to auscultation- rhonchi (-) wheezing      Cardiovascular Exercise Tolerance: Good (-) hypertension(-) CAD, (-) Past MI and (-) Cardiac Stents  Rhythm:Regular Rate:Normal - Systolic murmurs and - Diastolic murmurs    Neuro/Psych  Headaches, negative psych ROS   GI/Hepatic Neg liver ROS, GERD  ,  Endo/Other  negative endocrine ROSneg diabetes  Renal/GU negative Renal ROS     Musculoskeletal negative musculoskeletal ROS (+)   Abdominal (+) - obese,   Peds  Hematology negative hematology ROS (+)   Anesthesia Other Findings Past Medical History: No date: GERD (gastroesophageal reflux disease)     Comment:  NO MEDS No date: Headache No date: Palpitations     Comment:  PT SAW DR Nehemiah Massed IN 2017 AND STRESS TEST WAS WNL-HE               DID NOT REQUIRE HER TO F/U DUE TO BEING BENIGN -PT STILL               WILL HAVE PALPITATIONS BUT IS ASYMPTOMATIC   Reproductive/Obstetrics                             Anesthesia Physical Anesthesia Plan  ASA: II  Anesthesia Plan: General   Post-op Pain Management:    Induction: Intravenous  PONV Risk Score and Plan: 2 and Ondansetron and Dexamethasone  Airway Management Planned: Oral ETT  Additional Equipment:   Intra-op Plan:   Post-operative Plan: Extubation in OR  Informed Consent: I have reviewed the patients History and Physical, chart, labs and discussed the procedure including the risks, benefits and  alternatives for the proposed anesthesia with the patient or authorized representative who has indicated his/her understanding and acceptance.   Dental advisory given  Plan Discussed with: CRNA and Anesthesiologist  Anesthesia Plan Comments:         Anesthesia Quick Evaluation

## 2017-06-20 NOTE — Transfer of Care (Signed)
Immediate Anesthesia Transfer of Care Note  Patient: Hannah Robbins  Procedure(s) Performed: Procedure(s): HYSTERECTOMY VAGINAL (N/A) BILATERAL SALPINGECTOMY (Bilateral)  Patient Location: PACU  Anesthesia Type:General  Level of Consciousness: sedated  Airway & Oxygen Therapy: Patient Spontanous Breathing  Post-op Assessment: Report given to RN and Post -op Vital signs reviewed and stable  Post vital signs: Reviewed and stable  Last Vitals:  Vitals:   06/20/17 0630  BP: 133/77  Pulse: 84  Resp: 18  Temp: 37.2 C    Last Pain:  Vitals:   06/20/17 0630  TempSrc: Tympanic  PainSc: 0-No pain         Complications: No apparent anesthesia complications

## 2017-06-20 NOTE — Progress Notes (Signed)
Assisted up to the bedside commode, urinated and then back to the recliner chair. Felt hot and a little nauseated with the movement. Reclined back in the chair for another hour and felt better after eating more crackers, drinking more fluids. Got dressed and went to get into the wheelchair to go home and felt a little nauseated and hot again. Assisted back to a reclining position in the recliner. Given zofran, provided quiet and lights off. Husband and son left to get lunch while patient takes a nap.

## 2017-06-20 NOTE — Addendum Note (Signed)
Addendum  created 06/20/17 1035 by Letitia Neri, CRNA   Anesthesia Intra Flowsheets edited

## 2017-06-20 NOTE — Anesthesia Post-op Follow-up Note (Cosign Needed)
Anesthesia QCDR form completed.        

## 2017-06-20 NOTE — Anesthesia Procedure Notes (Signed)
Procedure Name: Intubation Date/Time: 06/20/2017 7:51 AM Performed by: Letitia Neri Pre-anesthesia Checklist: Patient identified, Emergency Drugs available, Suction available, Patient being monitored and Timeout performed Patient Re-evaluated:Patient Re-evaluated prior to induction Oxygen Delivery Method: Circle system utilized Preoxygenation: Pre-oxygenation with 100% oxygen Induction Type: IV induction Ventilation: Mask ventilation without difficulty Laryngoscope Size: Mac and 3 Grade View: Grade I Tube type: Oral Tube size: 7.0 mm Number of attempts: 1 Placement Confirmation: ETT inserted through vocal cords under direct vision,  positive ETCO2,  CO2 detector and breath sounds checked- equal and bilateral Secured at: 22 cm Tube secured with: Tape Dental Injury: Teeth and Oropharynx as per pre-operative assessment

## 2017-06-22 LAB — SURGICAL PATHOLOGY

## 2017-08-01 DIAGNOSIS — R51 Headache: Secondary | ICD-10-CM

## 2017-08-01 DIAGNOSIS — R519 Headache, unspecified: Secondary | ICD-10-CM | POA: Insufficient documentation

## 2017-08-31 ENCOUNTER — Emergency Department
Admission: EM | Admit: 2017-08-31 | Discharge: 2017-08-31 | Disposition: A | Discharge status: Home / Self Care | Payer: BC Managed Care – PPO | Attending: Emergency Medicine | Admitting: Emergency Medicine

## 2017-08-31 ENCOUNTER — Encounter: Payer: Self-pay | Admitting: Emergency Medicine

## 2017-08-31 ENCOUNTER — Emergency Department: Payer: BC Managed Care – PPO

## 2017-08-31 DIAGNOSIS — M7918 Myalgia, other site: Secondary | ICD-10-CM

## 2017-08-31 DIAGNOSIS — Z79899 Other long term (current) drug therapy: Secondary | ICD-10-CM | POA: Diagnosis not present

## 2017-08-31 DIAGNOSIS — M791 Myalgia: Secondary | ICD-10-CM | POA: Diagnosis present

## 2017-08-31 DIAGNOSIS — N83202 Unspecified ovarian cyst, left side: Secondary | ICD-10-CM | POA: Insufficient documentation

## 2017-08-31 LAB — URINALYSIS, COMPLETE (UACMP) WITH MICROSCOPIC
BACTERIA UA: NONE SEEN
Bilirubin Urine: NEGATIVE
GLUCOSE, UA: NEGATIVE mg/dL
Hgb urine dipstick: NEGATIVE
KETONES UR: NEGATIVE mg/dL
Leukocytes, UA: NEGATIVE
Nitrite: NEGATIVE
PROTEIN: NEGATIVE mg/dL
RBC / HPF: NONE SEEN RBC/hpf (ref 0–5)
Specific Gravity, Urine: 1.01 (ref 1.005–1.030)
WBC, UA: NONE SEEN WBC/hpf (ref 0–5)
pH: 7 (ref 5.0–8.0)

## 2017-08-31 LAB — COMPREHENSIVE METABOLIC PANEL
ALK PHOS: 63 U/L (ref 38–126)
ALT: 28 U/L (ref 14–54)
AST: 30 U/L (ref 15–41)
Albumin: 4.3 g/dL (ref 3.5–5.0)
Anion gap: 7 (ref 5–15)
BILIRUBIN TOTAL: 0.6 mg/dL (ref 0.3–1.2)
BUN: 11 mg/dL (ref 6–20)
CALCIUM: 9.3 mg/dL (ref 8.9–10.3)
CO2: 20 mmol/L — AB (ref 22–32)
CREATININE: 0.66 mg/dL (ref 0.44–1.00)
Chloride: 111 mmol/L (ref 101–111)
Glucose, Bld: 98 mg/dL (ref 65–99)
Potassium: 4 mmol/L (ref 3.5–5.1)
SODIUM: 138 mmol/L (ref 135–145)
TOTAL PROTEIN: 7.3 g/dL (ref 6.5–8.1)

## 2017-08-31 LAB — CBC WITH DIFFERENTIAL/PLATELET
BASOS ABS: 0 10*3/uL (ref 0–0.1)
BASOS PCT: 0 %
EOS ABS: 0 10*3/uL (ref 0–0.7)
Eosinophils Relative: 0 %
HEMATOCRIT: 39.9 % (ref 35.0–47.0)
HEMOGLOBIN: 14.2 g/dL (ref 12.0–16.0)
Lymphocytes Relative: 17 %
Lymphs Abs: 1.6 10*3/uL (ref 1.0–3.6)
MCH: 32.3 pg (ref 26.0–34.0)
MCHC: 35.6 g/dL (ref 32.0–36.0)
MCV: 90.7 fL (ref 80.0–100.0)
MONOS PCT: 6 %
Monocytes Absolute: 0.6 10*3/uL (ref 0.2–0.9)
NEUTROS PCT: 77 %
Neutro Abs: 7.5 10*3/uL — ABNORMAL HIGH (ref 1.4–6.5)
PLATELETS: 266 10*3/uL (ref 150–440)
RBC: 4.4 MIL/uL (ref 3.80–5.20)
RDW: 12.6 % (ref 11.5–14.5)
WBC: 9.7 10*3/uL (ref 3.6–11.0)

## 2017-08-31 MED ORDER — OXYCODONE-ACETAMINOPHEN 7.5-325 MG PO TABS: 1.0000 | ORAL_TABLET | Freq: Four times a day (QID) | ORAL | 0 refills | Status: DC | PRN

## 2017-08-31 MED ORDER — ORPHENADRINE CITRATE 30 MG/ML IJ SOLN
60.0000 mg | Freq: Two times a day (BID) | INTRAMUSCULAR | Status: DC
  Administered 2017-08-31: 60 mg via INTRAVENOUS
  Filled 2017-08-31: qty 2

## 2017-08-31 MED ORDER — CYCLOBENZAPRINE HCL 10 MG PO TABS: 10.0000 mg | ORAL_TABLET | Freq: Three times a day (TID) | ORAL | 0 refills | Status: DC | PRN

## 2017-08-31 MED ORDER — IBUPROFEN 600 MG PO TABS: 600.0000 mg | ORAL_TABLET | Freq: Three times a day (TID) | ORAL | 0 refills | Status: DC | PRN

## 2017-08-31 MED ORDER — IOPAMIDOL (ISOVUE-300) INJECTION 61%
100.0000 mL | Freq: Once | INTRAVENOUS | Status: AC | PRN
  Administered 2017-08-31: 100 mL via INTRAVENOUS
  Filled 2017-08-31: qty 100

## 2017-08-31 MED ORDER — HYDROMORPHONE HCL 1 MG/ML IJ SOLN
1.0000 mg | Freq: Once | INTRAMUSCULAR | Status: AC
  Administered 2017-08-31: 1 mg via INTRAVENOUS
  Filled 2017-08-31: qty 1

## 2017-08-31 MED ORDER — KETOROLAC TROMETHAMINE 30 MG/ML IJ SOLN
30.0000 mg | Freq: Once | INTRAMUSCULAR | Status: AC
  Administered 2017-08-31: 30 mg via INTRAVENOUS
  Filled 2017-08-31: qty 1
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
## 2017-08-31 NOTE — ED Notes (Signed)
Patient transported to CT 

## 2017-09-06 ENCOUNTER — Other Ambulatory Visit: Payer: Self-pay | Admitting: Otolaryngology

## 2017-09-06 DIAGNOSIS — F0781 Postconcussional syndrome: Secondary | ICD-10-CM

## 2017-09-06 DIAGNOSIS — H903 Sensorineural hearing loss, bilateral: Secondary | ICD-10-CM

## 2017-09-06 DIAGNOSIS — S060X0A Concussion without loss of consciousness, initial encounter: Secondary | ICD-10-CM

## 2017-09-08 ENCOUNTER — Ambulatory Visit
Admission: RE | Admit: 2017-09-08 | Discharge: 2017-09-08 | Disposition: A | Discharge status: Home / Self Care | Payer: BC Managed Care – PPO | Source: Ambulatory Visit | Attending: Otolaryngology | Admitting: Otolaryngology

## 2017-09-08 DIAGNOSIS — F0781 Postconcussional syndrome: Secondary | ICD-10-CM | POA: Diagnosis present

## 2017-09-08 DIAGNOSIS — H903 Sensorineural hearing loss, bilateral: Secondary | ICD-10-CM | POA: Insufficient documentation

## 2017-09-08 DIAGNOSIS — S060X0A Concussion without loss of consciousness, initial encounter: Secondary | ICD-10-CM

## 2017-09-12 DIAGNOSIS — F0781 Postconcussional syndrome: Secondary | ICD-10-CM | POA: Insufficient documentation

## 2017-12-22 ENCOUNTER — Other Ambulatory Visit: Payer: Self-pay | Admitting: Obstetrics and Gynecology

## 2017-12-22 DIAGNOSIS — Z1231 Encounter for screening mammogram for malignant neoplasm of breast: Secondary | ICD-10-CM

## 2018-02-01 ENCOUNTER — Ambulatory Visit
Admission: RE | Admit: 2018-02-01 | Discharge: 2018-02-01 | Disposition: A | Discharge status: Home / Self Care | Payer: BC Managed Care – PPO | Source: Ambulatory Visit | Attending: Obstetrics and Gynecology | Admitting: Obstetrics and Gynecology

## 2018-02-01 DIAGNOSIS — Z1231 Encounter for screening mammogram for malignant neoplasm of breast: Secondary | ICD-10-CM | POA: Insufficient documentation

## 2018-03-06 HISTORY — PX: MYRINGOTOMY WITH TUBE PLACEMENT: SHX5663

## 2018-04-24 ENCOUNTER — Other Ambulatory Visit
Admission: RE | Admit: 2018-04-24 | Discharge: 2018-04-24 | Disposition: A | Discharge status: Home / Self Care | Payer: BC Managed Care – PPO | Source: Ambulatory Visit | Attending: Gastroenterology | Admitting: Gastroenterology

## 2018-04-24 ENCOUNTER — Ambulatory Visit (INDEPENDENT_AMBULATORY_CARE_PROVIDER_SITE_OTHER): Payer: BC Managed Care – PPO | Admitting: Gastroenterology

## 2018-04-24 ENCOUNTER — Encounter (INDEPENDENT_AMBULATORY_CARE_PROVIDER_SITE_OTHER): Payer: Self-pay

## 2018-04-24 ENCOUNTER — Encounter: Payer: Self-pay | Admitting: Gastroenterology

## 2018-04-24 VITALS — BP 130/77 | HR 90 | Ht 65.0 in | Wt 169.0 lb

## 2018-04-24 DIAGNOSIS — N83299 Other ovarian cyst, unspecified side: Secondary | ICD-10-CM | POA: Insufficient documentation

## 2018-04-24 DIAGNOSIS — M545 Low back pain, unspecified: Secondary | ICD-10-CM | POA: Insufficient documentation

## 2018-04-24 DIAGNOSIS — R197 Diarrhea, unspecified: Secondary | ICD-10-CM

## 2018-04-24 DIAGNOSIS — M75100 Unspecified rotator cuff tear or rupture of unspecified shoulder, not specified as traumatic: Secondary | ICD-10-CM | POA: Insufficient documentation

## 2018-04-24 DIAGNOSIS — R102 Pelvic and perineal pain: Secondary | ICD-10-CM | POA: Insufficient documentation

## 2018-04-24 DIAGNOSIS — N921 Excessive and frequent menstruation with irregular cycle: Secondary | ICD-10-CM | POA: Insufficient documentation

## 2018-04-24 DIAGNOSIS — Z8742 Personal history of other diseases of the female genital tract: Secondary | ICD-10-CM | POA: Insufficient documentation

## 2018-04-24 LAB — C DIFFICILE QUICK SCREEN W PCR REFLEX
C DIFFICLE (CDIFF) ANTIGEN: NEGATIVE
C Diff interpretation: NOT DETECTED
C Diff toxin: NEGATIVE

## 2018-04-24 MED ORDER — METRONIDAZOLE 500 MG PO TABS: 500.0000 mg | ORAL_TABLET | Freq: Three times a day (TID) | ORAL | 0 refills | Status: DC

## 2018-04-24 NOTE — Progress Notes (Signed)
Gastroenterology Consultation  Referring Provider:     Idelle Crouch, MD Primary Care Physician:  Idelle Crouch, MD Primary Gastroenterologist:  Dr. Allen Norris     Reason for Consultation:     Diarrhea        HPI:   Hannah Robbins is a 47 y.o. y/o female referred for consultation & management of Diarrhea by Dr. Doy Hutching, Leonie Douglas, MD.  This patient comes in today after calling her ENT physician for diarrhea.  The ENT office called my office and asked if the patient could be seen urgently today. The patient reports that she has been on 4 different antibiotics recently for sinus problems.  She states that she had some side effects such as a metal taste in her mouth from one of the medications but otherwise had been doing well as far as side effects from the antibiotics.  The patient states that her sinus problems have not resolved therefore she continues to take antibiotics. With this recent round of antibiotics the patient states she started to have diarrhea which was very watery and foul smelling.  She also had some abdominal cramps that usually resolve after she goes to the bathroom.  There is no report of any fevers chills nausea vomiting or unexplained weight loss.  Past Medical History:  Diagnosis Date  . GERD (gastroesophageal reflux disease)    NO MEDS  . Headache   . Palpitations    PT SAW DR Nehemiah Massed IN 2017 AND STRESS TEST WAS WNL-HE DID NOT REQUIRE HER TO F/U DUE TO BEING BENIGN -PT STILL WILL HAVE PALPITATIONS BUT IS ASYMPTOMATIC    Past Surgical History:  Procedure Laterality Date  . BILATERAL SALPINGECTOMY Bilateral 06/20/2017   Procedure: BILATERAL SALPINGECTOMY;  Surgeon: Benjaman Kindler, MD;  Location: [facility] ORS;  Service: Gynecology;  Laterality: Bilateral;  . BREAST BIOPSY Left 2013   stereotactic with clip  . ESOPHAGOGASTRODUODENOSCOPY    . FOOT SURGERY    . LASIK    . NASAL SINUS SURGERY    . TONSILLECTOMY    . VAGINAL HYSTERECTOMY N/A 06/20/2017   Procedure: HYSTERECTOMY VAGINAL;  Surgeon: Benjaman Kindler, MD;  Location: [facility] OR[facility];  Service: Gynecology;  Laterality: N/A;    Prior to Admission medications   Medication Sig Start Date End Date Taking? Authorizing Provider  clindamycin (CLEOCIN) 300 MG capsule  04/17/18  Yes [provider]  ibuprofen (ADVIL,MOTRIN) 600 MG tablet Take 1 tablet (600 mg total) by mouth every 8 (eight) hours as needed. 08/31/17  Yes Sable Feil, PA-C  ibuprofen (ADVIL,MOTRIN) 800 MG tablet Take 1 tablet (800 mg total) by mouth every 8 (eight) hours as needed for moderate pain. 06/20/17  Yes Benjaman Kindler, MD  Multiple Vitamin (MULTIVITAMIN WITH MINERALS) TABS tablet Take 3 tablets by mouth 2 (two) times daily. DoTerra Blend   Yes [provider]  Probiotic Product (PROBIOTIC DAILY PO) Take 1 tablet by mouth daily.   Yes [provider]  triamcinolone (NASACORT ALLERGY 24HR) 55 MCG/ACT AERO nasal inhaler Place 2 sprays into the nose daily.   Yes [provider]  amphetamine-dextroamphetamine (ADDERALL) 10 MG tablet Take 10 mg by mouth 2 (two) times daily. 03/22/17   [provider]  gabapentin (NEURONTIN) 800 MG tablet Take 1 tablet (800 mg total) by mouth at bedtime. 06/20/17 06/23/17  Benjaman Kindler, MD  metroNIDAZOLE (FLAGYL) 500 MG tablet Take 1 tablet (500 mg total) by mouth 3 (three) times daily. 04/24/18   Lucilla Lame, MD  topiramate (TOPAMAX) 25 MG tablet  04/03/18   [provider]    Family History  Problem Relation Age of Onset  . Breast cancer Maternal Grandmother 60  . Breast cancer Mother 81     Social History   Tobacco Use  . Smoking status: Never Smoker  . Smokeless tobacco: Never Used  Substance Use Topics  . Alcohol use: Yes    Comment: WINE OCC  . Drug use: No    Allergies as of 04/24/2018 - Review Complete 04/24/2018  Allergen Reaction Noted  . Other  06/02/2017  . Ceftin [cefuroxime axetil] Hives and Rash 04/12/2017  .  Sulfa antibiotics Hives and Rash 04/12/2017  . Tetracyclines & related Hives and Rash 04/12/2017    Review of Systems:    All systems reviewed and negative except where noted in HPI.   Physical Exam:  BP 130/77   Pulse 90   Ht 5\' 5"  (1.651 m)   Wt 169 lb (76.7 kg)   LMP 04/28/2017 (Exact Date)   BMI 28.12 kg/m  Patient's last menstrual period was 04/28/2017 (exact date). Psych:  Alert and cooperative. Normal mood and affect. General:   Alert,  Well-developed, well-nourished, pleasant and cooperative in NAD Head:  Normocephalic and atraumatic. Eyes:  Sclera clear, no icterus.   Conjunctiva pink. Ears:  Normal auditory acuity. Nose:  No deformity, discharge, or lesions. Mouth:  No deformity or lesions,oropharynx pink & moist. Neck:  Supple; no masses or thyromegaly. Lungs:  Respirations even and unlabored.  Clear throughout to auscultation.   No wheezes, crackles, or rhonchi. No acute distress. Heart:  Regular rate and rhythm; no murmurs, clicks, rubs, or gallops. Abdomen:  Normal bowel sounds.  No bruits.  Soft, non-tender and non-distended without masses, hepatosplenomegaly or hernias noted.  No guarding or rebound tenderness.  Negative Carnett sign.   Rectal:  Deferred.  Msk:  Symmetrical without gross deformities.  Good, equal movement & strength bilaterally. Pulses:  Normal pulses noted. Extremities:  No clubbing or edema.  No cyanosis. Neurologic:  Alert and oriented x3;  grossly normal neurologically. Skin:  Intact without significant lesions or rashes.  No jaundice. Lymph Nodes:  No significant cervical adenopathy. Psych:  Alert and cooperative. Normal mood and affect.  Imaging Studies: No results found.  Assessment and Plan:   Hannah Robbins is a 47 y.o. y/o female who comes in today with diarrhea that has become very bothersome after eating on 4 separate rounds of antibiotics. The patient will have her stools checked for pathogens including C. Difficile.  The  patient will also be started on Flagyl 500 mg 3 times a day for possible C. Difficile colitis.  The patient has been taking probiotics and having yogurt and I have explained to her that since she is taking antibiotics that may just be killing off her probiotics.  The patient will be contacted with the results of her stool studies.  She has also been encouraged to consider some long-term solution for her sinus problems since if she has gotten C. Difficile from the antibiotic she is likely to get it again.  The patient has been explained the plan and agrees with it.  Lucilla Lame, MD. Marval Regal   Note: This dictation was prepared with Dragon dictation along with smaller phrase technology. Any transcriptional errors that result from this process are unintentional.

## 2018-04-25 ENCOUNTER — Telehealth: Payer: Self-pay

## 2018-04-25 LAB — GASTROINTESTINAL PANEL BY PCR, STOOL (REPLACES STOOL CULTURE)
Adenovirus F40/41: NOT DETECTED
Astrovirus: NOT DETECTED
CRYPTOSPORIDIUM: NOT DETECTED
Campylobacter species: NOT DETECTED
Cyclospora cayetanensis: NOT DETECTED
Entamoeba histolytica: NOT DETECTED
Enteroaggregative E coli (EAEC): NOT DETECTED
Enteropathogenic E coli (EPEC): NOT DETECTED
Enterotoxigenic E coli (ETEC): NOT DETECTED
Giardia lamblia: NOT DETECTED
Norovirus GI/GII: NOT DETECTED
PLESIMONAS SHIGELLOIDES: NOT DETECTED
ROTAVIRUS A: NOT DETECTED
SALMONELLA SPECIES: NOT DETECTED
SAPOVIRUS (I, II, IV, AND V): NOT DETECTED
SHIGELLA/ENTEROINVASIVE E COLI (EIEC): NOT DETECTED
Shiga like toxin producing E coli (STEC): NOT DETECTED
Vibrio cholerae: NOT DETECTED
Vibrio species: NOT DETECTED
YERSINIA ENTEROCOLITICA: NOT DETECTED

## 2018-04-25 NOTE — Telephone Encounter (Signed)
Pt notified of lab results

## 2018-04-25 NOTE — Telephone Encounter (Signed)
Pt notified of results

## 2018-04-25 NOTE — Telephone Encounter (Signed)
-----   Message from Lucilla Lame, MD sent at 04/25/2018 11:18 AM EDT ----- Let the patient know that the stool test were negative

## 2018-04-25 NOTE — Telephone Encounter (Signed)
-----   Message from Lucilla Lame, MD sent at 04/24/2018  6:50 PM EDT ----- That the patient knows that her C. Difficile is negative but she should continue the Flagyl until the rest of the labs are back.

## 2018-04-28 ENCOUNTER — Encounter: Payer: Self-pay | Admitting: Gastroenterology

## 2018-05-04 NOTE — Discharge Instructions (Signed)
[facility] °[facility] °ENDOSCOPIC SINUS SURGERY °[facility] ° °What is Functional Endoscopic Sinus Surgery? ° The Surgery involves making the natural openings of the sinuses larger by removing the bony partitions that separate the sinuses from the nasal cavity.  The natural sinus lining is preserved as much as possible to allow the sinuses to resume normal function after the surgery.  In some patients nasal polyps (excessively swollen lining of the sinuses) may be removed to relieve obstruction of the sinus openings.  The surgery is performed through the nose using lighted scopes, which eliminates the need for incisions on the face.  A septoplasty is a different procedure which is sometimes performed with sinus surgery.  It involves straightening the boy partition that separates the two sides of your nose.  A crooked or deviated septum may need repair if is obstructing the sinuses or nasal airflow.  Turbinate reduction is also often performed during sinus surgery.  The turbinates are bony proturberances from the side walls of the nose which swell and can obstruct the nose in patients with sinus and allergy problems.  Their size can be surgically reduced to help relieve nasal obstruction. ° °What Can Sinus Surgery Do For Me? ° Sinus surgery can reduce the frequency of sinus infections requiring antibiotic treatment.  This can provide improvement in nasal congestion, post-nasal drainage, facial pressure and nasal obstruction.  Surgery will NOT prevent you from ever having an infection again, so it usually only for patients who get infections 4 or more times yearly requiring antibiotics, or for infections that do not clear with antibiotics.  It will not cure nasal allergies, so patients with allergies may still require medication to treat their allergies after surgery. Surgery may improve headaches related to sinusitis, however, some people will continue to  require medication to control sinus headaches related to allergies.  Surgery will do nothing for other forms of headache (migraine, tension or cluster). ° °What Are the Risks of Endoscopic Sinus Surgery? ° Current techniques allow surgery to be performed safely with little risk, however, there are rare complications that patients should be aware of.  Because the sinuses are located around the eyes, there is risk of eye injury, including blindness, though again, this would be quite rare. This is usually a result of bleeding behind the eye during surgery, which puts the vision oat risk, though there are treatments to protect the vision and prevent permanent disrupted by surgery causing a leak of the spinal fluid that surrounds the brain.  More serious complications would include bleeding inside the brain cavity or damage to the brain.  Again, all of these complications are uncommon, and spinal fluid leaks can be safely managed surgically if they occur.  The most common complication of sinus surgery is bleeding from the nose, which may require packing or cauterization of the nose.  Continued sinus have polyps may experience recurrence of the polyps requiring revision surgery.  Alterations of sense of smell or injury to the tear ducts are also rare complications.  ° °What is the Surgery Like, and what is the Recovery? ° The Surgery usually takes a couple of hours to perform, and is usually performed under a general anesthetic (completely asleep).  Patients are usually discharged home after a couple of hours.  Sometimes during surgery it is necessary to pack the nose to control bleeding, and the packing is left in place for 24 - 48 hours, and removed by your surgeon.    If a septoplasty was performed during the procedure, there is often a splint placed which must be removed after 5-7 days.   °Discomfort: Pain is usually mild to moderate, and can be controlled by prescription pain medication or acetaminophen (Tylenol).   Aspirin, Ibuprofen (Advil, Motrin), or Naprosyn (Aleve) should be avoided, as they can cause increased bleeding.  Most patients feel sinus pressure like they have a bad head cold for several days.  Sleeping with your head elevated can help reduce swelling and facial pressure, as can ice packs over the face.  A humidifier may be helpful to keep the mucous and blood from drying in the nose.  ° °Diet: There are no specific diet restrictions, however, you should generally start with clear liquids and a light diet of bland foods because the anesthetic can cause some nausea.  Advance your diet depending on how your stomach feels.  Taking your pain medication with food will often help reduce stomach upset which pain medications can cause. ° °Nasal Saline Irrigation: It is important to remove blood clots and dried mucous from the nose as it is healing.  This is done by having you irrigate the nose at least 3 - 4 times daily with a salt water solution.  We recommend using NeilMed Sinus Rinse (available at the drug store).  Fill the squeeze bottle with the solution, bend over a sink, and insert the tip of the squeeze bottle into the nose ½ of an inch.  Point the tip of the squeeze bottle towards the inside corner of the eye on the same side your irrigating.  Squeeze the bottle and gently irrigate the nose.  If you bend forward as you do this, most of the fluid will flow back out of the nose, instead of down your throat.   The solution should be warm, near body temperature, when you irrigate.   Each time you irrigate, you should use a full squeeze bottle.  ° °Note that if you are instructed to use Nasal Steroid Sprays at any time after your surgery, irrigate with saline BEFORE using the steroid spray, so you do not wash it all out of the nose. °Another product, Nasal Saline Gel (such as AYR Nasal Saline Gel) can be applied in each nostril 3 - 4 times daily to moisture the nose and reduce scabbing or crusting. ° °Bleeding:   Bloody drainage from the nose can be expected for several days, and patients are instructed to irrigate their nose frequently with salt water to help remove mucous and blood clots.  The drainage may be dark red or brown, though some fresh blood may be seen intermittently, especially after irrigation.  Do not blow you nose, as bleeding may occur. If you must sneeze, keep your mouth open to allow air to escape through your mouth. ° °If heavy bleeding occurs: Irrigate the nose with saline to rinse out clots, then spray the nose 3 - 4 times with Afrin Nasal Decongestant Spray.  The spray will constrict the blood vessels to slow bleeding.  Pinch the lower half of your nose shut to apply pressure, and lay down with your head elevated.  Ice packs over the nose may help as well. If bleeding persists despite these measures, you should notify your doctor.  Do not use the Afrin routinely to control nasal congestion after surgery, as it can result in worsening congestion and may affect healing.  ° ° ° °Activity: Return to work varies among patients. Most patients will be   out of work at least 5 - 7 days to recover.  Patient may return to work after they are off of narcotic pain medication, and feeling well enough to perform the functions of their job.  Patients must avoid heavy lifting (over 10 pounds) or strenuous physical for 2 weeks after surgery, so your employer may need to assign you to light duty, or keep you out of work longer if light duty is not possible.  NOTE: you should not drive, operate dangerous machinery, do any mentally demanding tasks or make any important legal or financial decisions while on narcotic pain medication and recovering from the general anesthetic.  °  °Call Your Doctor Immediately if You Have Any of the Following: °1. Bleeding that you cannot control with the above measures °2. Loss of vision, double vision, bulging of the eye or black eyes. °3. Fever over 101 degrees °4. Neck stiffness with  severe headache, fever, nausea and change in mental state. °You are always encourage to call anytime with concerns, however, please call with requests for pain medication refills during office hours. ° °Office Endoscopy: During follow-up visits your doctor will remove any packing or splints that may have been placed and evaluate and clean your sinuses endoscopically.  Topical anesthetic will be used to make this as comfortable as possible, though you may want to take your pain medication prior to the visit.  How often this will need to be done varies from patient to patient.  After complete recovery from the surgery, you may need follow-up endoscopy from time to time, particularly if there is concern of recurrent infection or nasal polyps. ° ° °General Anesthesia, Adult, Care After °These instructions provide you with information about caring for yourself after your procedure. Your health care provider may also give you more specific instructions. Your treatment has been planned according to current medical practices, but problems sometimes occur. Call your health care provider if you have any problems or questions after your procedure. °What can I expect after the procedure? °After the procedure, it is common to have: °· Vomiting. °· A sore throat. °· Mental slowness. ° °It is common to feel: °· Nauseous. °· Cold or shivery. °· Sleepy. °· Tired. °· Sore or achy, even in parts of your body where you did not have surgery. ° °Follow these instructions at home: °For at least 24 hours after the procedure: °· Do not: °? Participate in activities where you could fall or become injured. °? Drive. °? Use heavy machinery. °? Drink alcohol. °? Take sleeping pills or medicines that cause drowsiness. °? Make important decisions or sign legal documents. °? Take care of children on your own. °· Rest. °Eating and drinking °· If you vomit, drink water, juice, or soup when you can drink without vomiting. °· Drink enough fluid to  keep your urine clear or pale yellow. °· Make sure you have little or no nausea before eating solid foods. °· Follow the diet recommended by your health care provider. °General instructions °· Have a responsible adult stay with you until you are awake and alert. °· Return to your normal activities as told by your health care provider. Ask your health care provider what activities are safe for you. °· Take over-the-counter and prescription medicines only as told by your health care provider. °· If you smoke, do not smoke without supervision. °· Keep all follow-up visits as told by your health care provider. This is important. °Contact a health care provider if: °· You   continue to have nausea or vomiting at home, and medicines are not helpful. °· You cannot drink fluids or start eating again. °· You cannot urinate after 8-12 hours. °· You develop a skin rash. °· You have fever. °· You have increasing redness at the site of your procedure. °Get help right away if: °· You have difficulty breathing. °· You have chest pain. °· You have unexpected bleeding. °· You feel that you are having a life-threatening or urgent problem. °This information is not intended to replace advice given to you by your health care provider. Make sure you discuss any questions you have with your health care provider. °Document Released: 02/28/2001 Document Revised: 04/26/2016 Document Reviewed: 11/06/2015 °[employer] Interactive Patient Education © 2018 [employer] ° °

## 2018-05-11 ENCOUNTER — Encounter
Admission: RE | Disposition: A | Discharge status: Home / Self Care | Payer: Self-pay | Source: Ambulatory Visit | Attending: Otolaryngology

## 2018-05-11 ENCOUNTER — Ambulatory Visit
Admission: RE | Admit: 2018-05-11 | Discharge: 2018-05-11 | Disposition: A | Discharge status: Home / Self Care | Payer: BC Managed Care – PPO | Source: Ambulatory Visit | Attending: Otolaryngology | Admitting: Otolaryngology

## 2018-05-11 ENCOUNTER — Encounter: Payer: Self-pay | Admitting: Otolaryngology

## 2018-05-11 ENCOUNTER — Ambulatory Visit: Payer: BC Managed Care – PPO | Admitting: Anesthesiology

## 2018-05-11 DIAGNOSIS — J323 Chronic sphenoidal sinusitis: Secondary | ICD-10-CM | POA: Diagnosis not present

## 2018-05-11 DIAGNOSIS — Z79899 Other long term (current) drug therapy: Secondary | ICD-10-CM | POA: Diagnosis not present

## 2018-05-11 DIAGNOSIS — J322 Chronic ethmoidal sinusitis: Secondary | ICD-10-CM | POA: Insufficient documentation

## 2018-05-11 DIAGNOSIS — J321 Chronic frontal sinusitis: Secondary | ICD-10-CM | POA: Insufficient documentation

## 2018-05-11 DIAGNOSIS — J320 Chronic maxillary sinusitis: Secondary | ICD-10-CM | POA: Diagnosis not present

## 2018-05-11 HISTORY — PX: IMAGE GUIDED SINUS SURGERY: SHX6570

## 2018-05-11 HISTORY — DX: Other specified postprocedural states: Z98.890

## 2018-05-11 HISTORY — DX: Nausea with vomiting, unspecified: R11.2

## 2018-05-11 HISTORY — PX: SPHENOIDECTOMY: SHX2421

## 2018-05-11 HISTORY — PX: SEPTOPLASTY WITH ETHMOIDECTOMY, AND MAXILLARY ANTROSTOMY: SHX6090

## 2018-05-11 SURGERY — SINUS SURGERY, WITH IMAGING GUIDANCE
Anesthesia: General | Site: Nose | Laterality: Left | Wound class: "Clean Contaminated "

## 2018-05-11 MED ORDER — OXYMETAZOLINE HCL 0.05 % NA SOLN
2.0000 | Freq: Once | NASAL | Status: AC
  Administered 2018-05-11: 2 via NASAL

## 2018-05-11 MED ORDER — DIPHENHYDRAMINE HCL 50 MG/ML IJ SOLN
12.5000 mg | Freq: Once | INTRAMUSCULAR | Status: AC
  Administered 2018-05-11: 12.5 mg via INTRAVENOUS

## 2018-05-11 MED ORDER — FENTANYL CITRATE (PF) 100 MCG/2ML IJ SOLN
INTRAMUSCULAR | Status: DC | PRN
  Administered 2018-05-11: 50 ug via INTRAVENOUS

## 2018-05-11 MED ORDER — OXYCODONE HCL 5 MG/5ML PO SOLN: 5.0000 mg | Freq: Once | ORAL | Status: AC | PRN

## 2018-05-11 MED ORDER — LIDOCAINE HCL (CARDIAC) PF 100 MG/5ML IV SOSY
PREFILLED_SYRINGE | INTRAVENOUS | Status: DC | PRN
  Administered 2018-05-11: 40 mg via INTRAVENOUS

## 2018-05-11 MED ORDER — OXYCODONE HCL 5 MG PO TABS
5.0000 mg | ORAL_TABLET | Freq: Once | ORAL | Status: AC | PRN
  Administered 2018-05-11: 5 mg via ORAL

## 2018-05-11 MED ORDER — HYDROMORPHONE HCL 1 MG/ML IJ SOLN
0.5000 mg | INTRAMUSCULAR | Status: AC | PRN
  Administered 2018-05-11: 0.5 mg via INTRAVENOUS

## 2018-05-11 MED ORDER — FENTANYL CITRATE (PF) 100 MCG/2ML IJ SOLN
25.0000 ug | INTRAMUSCULAR | Status: DC | PRN
  Administered 2018-05-11: 25 ug via INTRAVENOUS
  Administered 2018-05-11: 50 ug via INTRAVENOUS
  Administered 2018-05-11: 25 ug via INTRAVENOUS

## 2018-05-11 MED ORDER — LEVOFLOXACIN IN D5W 500 MG/100ML IV SOLN
500.0000 mg | INTRAVENOUS | Status: DC
  Administered 2018-05-11: 500 mg via INTRAVENOUS

## 2018-05-11 MED ORDER — LIDOCAINE-EPINEPHRINE 1 %-1:100000 IJ SOLN
INTRAMUSCULAR | Status: DC | PRN
  Administered 2018-05-11: 4 mL

## 2018-05-11 MED ORDER — ACETAMINOPHEN 10 MG/ML IV SOLN
1000.0000 mg | Freq: Once | INTRAVENOUS | Status: AC
  Administered 2018-05-11: 1000 mg via INTRAVENOUS

## 2018-05-11 MED ORDER — ROCURONIUM BROMIDE 100 MG/10ML IV SOLN
INTRAVENOUS | Status: DC | PRN
  Administered 2018-05-11: 25 mg via INTRAVENOUS

## 2018-05-11 MED ORDER — MIDAZOLAM HCL 5 MG/5ML IJ SOLN
INTRAMUSCULAR | Status: DC | PRN
  Administered 2018-05-11: 2 mg via INTRAVENOUS

## 2018-05-11 MED ORDER — PROPOFOL 10 MG/ML IV BOLUS
INTRAVENOUS | Status: DC | PRN
  Administered 2018-05-11: 200 mg via INTRAVENOUS

## 2018-05-11 MED ORDER — SCOPOLAMINE 1 MG/3DAYS TD PT72
1.0000 | MEDICATED_PATCH | Freq: Once | TRANSDERMAL | Status: DC
  Administered 2018-05-11: 1.5 mg via TRANSDERMAL

## 2018-05-11 MED ORDER — DEXAMETHASONE SODIUM PHOSPHATE 4 MG/ML IJ SOLN
INTRAMUSCULAR | Status: DC | PRN
  Administered 2018-05-11: 10 mg via INTRAVENOUS

## 2018-05-11 MED ORDER — GLYCOPYRROLATE 0.2 MG/ML IJ SOLN
INTRAMUSCULAR | Status: DC | PRN
  Administered 2018-05-11: 0.1 mg via INTRAVENOUS

## 2018-05-11 MED ORDER — LACTATED RINGERS IV SOLN: INTRAVENOUS | Status: DC

## 2018-05-11 MED ORDER — LACTATED RINGERS IV SOLN
INTRAVENOUS | Status: DC
  Administered 2018-05-11: 08:00:00 via INTRAVENOUS

## 2018-05-11 MED ORDER — LIDOCAINE HCL 1 % IJ SOLN
INTRAMUSCULAR | Status: DC | PRN
  Administered 2018-05-11: 3 mL via TOPICAL

## 2018-05-11 MED ORDER — ONDANSETRON HCL 4 MG/2ML IJ SOLN: 4.0000 mg | Freq: Once | INTRAMUSCULAR | Status: DC | PRN

## 2018-05-11 SURGICAL SUPPLY — 33 items
BALLOON SINUPLASTY SYSTEM (BALLOONS) IMPLANT
BATTERY INSTRU NAVIGATION (MISCELLANEOUS) ×12 IMPLANT
BLADE SHAVER DIEGO 4 40D (BLADE) ×1 IMPLANT
BLADE SHAVER DIEGO 4MM 40D (BLADE) ×1
CANISTER SUCT 1200ML W/VALVE (MISCELLANEOUS) ×4 IMPLANT
CATH IV 18X1 1/4 SAFELET (CATHETERS) ×4 IMPLANT
COAGULATOR SUCT 8FR VV (MISCELLANEOUS) ×4 IMPLANT
DEVICE INFLATION SEID (MISCELLANEOUS) IMPLANT
DRAPE HEAD BAR (DRAPES) ×4 IMPLANT
ELECT REM PT RETURN 9FT ADLT (ELECTROSURGICAL) ×4
ELECTRODE REM PT RTRN 9FT ADLT (ELECTROSURGICAL) ×2 IMPLANT
GLOVE PI ULTRA LF STRL 7.5 (GLOVE) ×4 IMPLANT
GLOVE PI ULTRA NON LATEX 7.5 (GLOVE) ×4
IV CATH 18X1 1/4 SAFELET (CATHETERS) ×2
IV NS 500ML (IV SOLUTION) ×2
IV NS 500ML BAXH (IV SOLUTION) ×2 IMPLANT
KIT TURNOVER KIT A (KITS) ×4 IMPLANT
NDL ANESTHESIA 27G X 3.5 (NEEDLE) ×2 IMPLANT
NEEDLE ANESTHESIA  27G X 3.5 (NEEDLE) ×2
NEEDLE ANESTHESIA 27G X 3.5 (NEEDLE) ×2 IMPLANT
NS IRRIG 500ML POUR BTL (IV SOLUTION) ×4 IMPLANT
PACK DRAPE NASAL/ENT (PACKS) ×4 IMPLANT
PACKING NASAL EPIS 4X2.4 XEROG (MISCELLANEOUS) ×8 IMPLANT
PATTIES SURGICAL .5 X3 (DISPOSABLE) ×4 IMPLANT
SHAVER DIEGO BLD STD TYPE A (BLADE) ×2 IMPLANT
SOL ANTI-FOG 6CC FOG-OUT (MISCELLANEOUS) ×2 IMPLANT
SOL FOG-OUT ANTI-FOG 6CC (MISCELLANEOUS) ×2
STRAP BODY AND KNEE 60X3 (MISCELLANEOUS) ×4 IMPLANT
SYR 3ML LL SCALE MARK (SYRINGE) ×4 IMPLANT
TOWEL OR 17X26 4PK STRL BLUE (TOWEL DISPOSABLE) ×4 IMPLANT
TRACKER CRANIALMASK (MASK) ×4 IMPLANT
TUBING DECLOG MULTIDEBRIDER (TUBING) ×4 IMPLANT
WATER STERILE IRR 250ML POUR (IV SOLUTION) ×4 IMPLANT

## 2018-05-11 NOTE — Op Note (Signed)
05/11/2018  12:09 PM    [name]  [id number]   Pre-Op Dx: Chronic bilateral maxillary sinusitis, chronic bilateral ethmoid sinusitis, chronic bilateral frontal sinusitis, chronic left sphenoid sinusitis  Post-op Dx: Same  Proc: Bilateral endoscopic total ethmoidectomies with frontal sinus exploration, bilateral endoscopic maxillary antrostomies, left endoscopic sphenoid sinusotomy, outfracture the inferior turbinates, use of image guided system  Surg:  Hannah Robbins  Anes:  GOT  EBL: 100 mL  Comp: None  Findings: The left ethmoid sinuses were fairly wide and the left maxillary antrum was very narrow and patent behind the upper portion of the inferior turbinate.  There is very difficult to get to make sure this was widened and open.  The ethmoid bulla was overlying much of this.  The right side was narrower.  There is a relatively small opening the maxillary antrum also.    Procedure: The patient was brought to the operating room and was placed in the supine position.  General anesthesia was given using oral endotracheal intubation.   the image guided system was then brought in and the CT scan was downloaded from the disc.  The template was then applied to face and the system was registered.  There was 0.5 mm of variance.  The suction instruments were registered and showed perfect alignment with the system.  The nose was prepped with cottonoid pledgets soaked in phenylephrine and Xylocaine.  She was prepped and draped in sterile fashion.  The cotton pledgets were removed and the 0 degrees scope was used to visualize both sides of the nose.  The superior right ethmoid plate anteriorly buccal to the right side making a little narrower anteriorly.  The left side had a very thin middle turbinate that a previously been trimmed of a large concha bullosa.  He was infractured but much of it became very unstable and most of the inferior portion of the middle turbinate had to be trimmed off.   Electrocautery was used as base to help control bleeding here.  The ethmoid bulla was very large and was overhanging much of the uncinate process that I could not get this trimmed with a side biter.  I removed the ethmoid bulla to help open up this area and allow better visualization of maxillary antrum.  The uncinate process was incised and completely removed by better opening into the maxillary antrum.  The antrum was widened using an angled microdebrider blade to be able to trim some the mucous membranes around the opening to make sure the natural ostium was included here.  This was set so far back it was difficult to get this area trimmed.  Finally got open where with the 30 and 70 degree scope against the use of the sinus.  There is some thickened mucous membranes but no sign of any lesions or polyps.  The 0 degrees scope was used then to open up the rest of the posterior ethmoid air cells.  The image guided system was used make sure all the air cells were clean.  The left sphenoid sinus had a thickened mucous membrane.  A small incision was made in the posterior edge of the superior turbinate to open up into the natural ostium.  This was widened with the sphenoid punch and microdebrider.  There is thickened mucous membranes in the sinus but no sign of lesions or mucus.  The 30 degrees scope was used for open up the middle and anterior ethmoid air cells.  Again thickened mucous membranes were found here.  There is a large agar nasi cell and the opening of the frontal sinus was found medially and required removal of some of the party wall between the frontal sinus duct and artery nasi cell.  This eventually then opened up into the frontal sinus and created a good opening here.  This was widened using the boss frontal sinus instruments that were through biting and the 70 degrees scope.  Once this sinus was completely opened were I can get into it very easily cottonoid pledgets were placed in the left side for  vasoconstriction.  The right side was then visualized and the anterior ethmoid plate was buckled the right side of [place] elevator was used to help push that back towards the midline.  It did not crack or give signs of the fractured.  It did pushover but then seemed like it would go back into its previous position.  The middle turbinate was infractured to visualize the middle meatus.  The uncinate process was incised and removed using the debrider and through biting forceps.  The 30 degrees scope was used to visualize the maxillary antrum make sure that this was widely open.  The image guided system was used to evaluate the anatomy.  There was Haller cell here that was opened widely.  The 0 degrees scope was used to open up the middle and posterior ethmoid air cells and the image guided system was used to make sure that all the air cells were cleared.  The right sphenoid was not entered or addressed.  The 30 degrees scope was used to open up the middle and anterior ethmoid air cells.  Again there was a large agar nasi cell and a opening was found posterior and medial to the frontal sinus duct.  The party wall between the agar nasi cell and frontal sinus duct was removed using the boss frontal sinus instruments that were through biting.  Once this was opened widely easy access into the frontal sinus then this completed opening the right side.  Cottonoid pledgets were placed to her temporarily.  Left side was revisualize.  There is no significant bleeding anywhere.  0 gel was then placed into the anterior ethmoid at the frontal sinus duct opening and more xerogel was placed into the sphenoid sinus and posterior ethmoid area.  The right side was then re-visualized and cottonoid pledget removed.  There is no bleeding here.  0 gel was then placed in the anterior ethmoid at the frontal sinus opening and then also into the posterior ethmoid area.  This helped to keep the middle turbinate medialized on the right side.   Both inferior turbinates were outfractured to give little better visualization and room through the inferior airway.  No splints were placed or other packing in the nose.  Total estimated blood loss was 100 mL.  Patient was awakened and taken to the recovery room in satisfactory condition.  There were no operative complications.  Dispo:   To PACU to be discharged home  Plan: To follow-up in the office in 6 days.  She will start saline flushes tomorrow every 3 hours while awake.  Will use a small prednisone taper and she will use clindamycin postop along with probiotics.  She will use some Norco for pain if needed or otherwise just use Tylenol.  Hannah Robbins  05/11/2018 12:09 PM

## 2018-05-11 NOTE — H&P (Signed)
H&P has been reviewedand patient reevaluated,  and no changes necessary. To be downloaded later.  

## 2018-05-11 NOTE — Anesthesia Procedure Notes (Signed)
Procedure Name: Intubation Date/Time: 05/11/2018 9:54 AM Performed by: Mayme Genta, CRNA Pre-anesthesia Checklist: Patient identified, Emergency Drugs available, Suction available, Patient being monitored and Timeout performed Patient Re-evaluated:Patient Re-evaluated prior to induction Oxygen Delivery Method: Circle system utilized Preoxygenation: Pre-oxygenation with 100% oxygen Induction Type: IV induction Ventilation: Mask ventilation without difficulty Laryngoscope Size: Miller and 2 Grade View: Grade I Tube type: Oral Rae Tube size: 7.0 mm Number of attempts: 1 Placement Confirmation: ETT inserted through vocal cords under direct vision,  positive ETCO2 and breath sounds checked- equal and bilateral Tube secured with: Tape Dental Injury: Teeth and Oropharynx as per pre-operative assessment

## 2018-05-11 NOTE — Anesthesia Postprocedure Evaluation (Signed)
Anesthesia Post Note  Patient: Hannah Robbins  Procedure(s) Performed: IMAGE GUIDED SINUS SURGERY (Bilateral Nose) ETHMOIDECTOMY, AND MAXILLARY ANTROSTOMY WITH TISSUE WITH FRONTAL SINUS EXLPORATION (Bilateral Nose) SPHENOIDECTOMY (Left Nose)  Patient location during evaluation: PACU Anesthesia Type: General Level of consciousness: awake and alert, oriented and patient cooperative Pain management: pain level controlled Vital Signs Assessment: post-procedure vital signs reviewed and stable Respiratory status: spontaneous breathing, nonlabored ventilation and respiratory function stable Cardiovascular status: blood pressure returned to baseline and stable Postop Assessment: adequate PO intake Anesthetic complications: no    Darrin Nipper

## 2018-05-11 NOTE — Anesthesia Preprocedure Evaluation (Signed)
Anesthesia Evaluation  Patient identified by MRN, date of birth, ID band Patient awake    Reviewed: Allergy & Precautions, NPO status , Patient's Chart, lab work & pertinent test results  History of Anesthesia Complications (+) PONV and history of anesthetic complications  Airway Mallampati: I  TM Distance: >3 FB Neck ROM: Full    Dental no notable dental hx.    Pulmonary    Pulmonary exam normal breath sounds clear to auscultation       Cardiovascular Exercise Tolerance: Good Normal cardiovascular exam Rhythm:Regular Rate:Normal  Palpitations    Neuro/Psych  Headaches,    GI/Hepatic negative GI ROS,   Endo/Other  negative endocrine ROS  Renal/GU negative Renal ROS     Musculoskeletal   Abdominal   Peds  Hematology negative hematology ROS (+)   Anesthesia Other Findings   Reproductive/Obstetrics                             Anesthesia Physical Anesthesia Plan  ASA: II  Anesthesia Plan: General   Post-op Pain Management:    Induction: Intravenous  PONV Risk Score and Plan: 3 and Ondansetron, Dexamethasone and Scopolamine patch - Pre-op  Airway Management Planned: Oral ETT  Additional Equipment:   Intra-op Plan:   Post-operative Plan: Extubation in OR  Informed Consent: I have reviewed the patients History and Physical, chart, labs and discussed the procedure including the risks, benefits and alternatives for the proposed anesthesia with the patient or authorized representative who has indicated his/her understanding and acceptance.     Plan Discussed with: CRNA  Anesthesia Plan Comments:         Anesthesia Quick Evaluation

## 2018-05-11 NOTE — Transfer of Care (Signed)
Immediate Anesthesia Transfer of Care Note  Patient: Hannah Robbins  Procedure(s) Performed: IMAGE GUIDED SINUS SURGERY (Bilateral Nose) ETHMOIDECTOMY, AND MAXILLARY ANTROSTOMY WITH TISSUE WITH FRONTAL SINUS EXLPORATION (Bilateral Nose) SPHENOIDECTOMY (Left Nose)  Patient Location: PACU  Anesthesia Type: General  Level of Consciousness: awake, alert  and patient cooperative  Airway and Oxygen Therapy: Patient Spontanous Breathing and Patient connected to supplemental oxygen  Post-op Assessment: Post-op Vital signs reviewed, Patient's Cardiovascular Status Stable, Respiratory Function Stable, Patent Airway and No signs of Nausea or vomiting  Post-op Vital Signs: Reviewed and stable  Complications: No apparent anesthesia complications

## 2018-05-12 ENCOUNTER — Encounter: Payer: Self-pay | Admitting: Otolaryngology

## 2018-05-16 LAB — SURGICAL PATHOLOGY

## 2018-06-01 ENCOUNTER — Encounter: Payer: Self-pay | Admitting: Gastroenterology

## 2018-09-27 ENCOUNTER — Other Ambulatory Visit: Payer: Self-pay

## 2018-09-27 ENCOUNTER — Encounter: Payer: Self-pay | Admitting: *Deleted

## 2018-10-02 NOTE — Discharge Instructions (Signed)
[facility] °[facility] °ENDOSCOPIC SINUS SURGERY °[facility] ° °What is Functional Endoscopic Sinus Surgery? ° The Surgery involves making the natural openings of the sinuses larger by removing the bony partitions that separate the sinuses from the nasal cavity.  The natural sinus lining is preserved as much as possible to allow the sinuses to resume normal function after the surgery.  In some patients nasal polyps (excessively swollen lining of the sinuses) may be removed to relieve obstruction of the sinus openings.  The surgery is performed through the nose using lighted scopes, which eliminates the need for incisions on the face.  A septoplasty is a different procedure which is sometimes performed with sinus surgery.  It involves straightening the boy partition that separates the two sides of your nose.  A crooked or deviated septum may need repair if is obstructing the sinuses or nasal airflow.  Turbinate reduction is also often performed during sinus surgery.  The turbinates are bony proturberances from the side walls of the nose which swell and can obstruct the nose in patients with sinus and allergy problems.  Their size can be surgically reduced to help relieve nasal obstruction. ° °What Can Sinus Surgery Do For Me? ° Sinus surgery can reduce the frequency of sinus infections requiring antibiotic treatment.  This can provide improvement in nasal congestion, post-nasal drainage, facial pressure and nasal obstruction.  Surgery will NOT prevent you from ever having an infection again, so it usually only for patients who get infections 4 or more times yearly requiring antibiotics, or for infections that do not clear with antibiotics.  It will not cure nasal allergies, so patients with allergies may still require medication to treat their allergies after surgery. Surgery may improve headaches related to sinusitis, however, some people will continue to  require medication to control sinus headaches related to allergies.  Surgery will do nothing for other forms of headache (migraine, tension or cluster). ° °What Are the Risks of Endoscopic Sinus Surgery? ° Current techniques allow surgery to be performed safely with little risk, however, there are rare complications that patients should be aware of.  Because the sinuses are located around the eyes, there is risk of eye injury, including blindness, though again, this would be quite rare. This is usually a result of bleeding behind the eye during surgery, which puts the vision oat risk, though there are treatments to protect the vision and prevent permanent disrupted by surgery causing a leak of the spinal fluid that surrounds the brain.  More serious complications would include bleeding inside the brain cavity or damage to the brain.  Again, all of these complications are uncommon, and spinal fluid leaks can be safely managed surgically if they occur.  The most common complication of sinus surgery is bleeding from the nose, which may require packing or cauterization of the nose.  Continued sinus have polyps may experience recurrence of the polyps requiring revision surgery.  Alterations of sense of smell or injury to the tear ducts are also rare complications.  ° °What is the Surgery Like, and what is the Recovery? ° The Surgery usually takes a couple of hours to perform, and is usually performed under a general anesthetic (completely asleep).  Patients are usually discharged home after a couple of hours.  Sometimes during surgery it is necessary to pack the nose to control bleeding, and the packing is left in place for 24 - 48 hours, and removed by your surgeon.    If a septoplasty was performed during the procedure, there is often a splint placed which must be removed after 5-7 days.   °Discomfort: Pain is usually mild to moderate, and can be controlled by prescription pain medication or acetaminophen (Tylenol).   Aspirin, Ibuprofen (Advil, Motrin), or Naprosyn (Aleve) should be avoided, as they can cause increased bleeding.  Most patients feel sinus pressure like they have a bad head cold for several days.  Sleeping with your head elevated can help reduce swelling and facial pressure, as can ice packs over the face.  A humidifier may be helpful to keep the mucous and blood from drying in the nose.  ° °Diet: There are no specific diet restrictions, however, you should generally start with clear liquids and a light diet of bland foods because the anesthetic can cause some nausea.  Advance your diet depending on how your stomach feels.  Taking your pain medication with food will often help reduce stomach upset which pain medications can cause. ° °Nasal Saline Irrigation: It is important to remove blood clots and dried mucous from the nose as it is healing.  This is done by having you irrigate the nose at least 3 - 4 times daily with a salt water solution.  We recommend using NeilMed Sinus Rinse (available at the drug store).  Fill the squeeze bottle with the solution, bend over a sink, and insert the tip of the squeeze bottle into the nose ½ of an inch.  Point the tip of the squeeze bottle towards the inside corner of the eye on the same side your irrigating.  Squeeze the bottle and gently irrigate the nose.  If you bend forward as you do this, most of the fluid will flow back out of the nose, instead of down your throat.   The solution should be warm, near body temperature, when you irrigate.   Each time you irrigate, you should use a full squeeze bottle.  ° °Note that if you are instructed to use Nasal Steroid Sprays at any time after your surgery, irrigate with saline BEFORE using the steroid spray, so you do not wash it all out of the nose. °Another product, Nasal Saline Gel (such as AYR Nasal Saline Gel) can be applied in each nostril 3 - 4 times daily to moisture the nose and reduce scabbing or crusting. ° °Bleeding:   Bloody drainage from the nose can be expected for several days, and patients are instructed to irrigate their nose frequently with salt water to help remove mucous and blood clots.  The drainage may be dark red or brown, though some fresh blood may be seen intermittently, especially after irrigation.  Do not blow you nose, as bleeding may occur. If you must sneeze, keep your mouth open to allow air to escape through your mouth. ° °If heavy bleeding occurs: Irrigate the nose with saline to rinse out clots, then spray the nose 3 - 4 times with Afrin Nasal Decongestant Spray.  The spray will constrict the blood vessels to slow bleeding.  Pinch the lower half of your nose shut to apply pressure, and lay down with your head elevated.  Ice packs over the nose may help as well. If bleeding persists despite these measures, you should notify your doctor.  Do not use the Afrin routinely to control nasal congestion after surgery, as it can result in worsening congestion and may affect healing.  ° ° ° °Activity: Return to work varies among patients. Most patients will be   out of work at least 5 - 7 days to recover.  Patient may return to work after they are off of narcotic pain medication, and feeling well enough to perform the functions of their job.  Patients must avoid heavy lifting (over 10 pounds) or strenuous physical for 2 weeks after surgery, so your employer may need to assign you to light duty, or keep you out of work longer if light duty is not possible.  NOTE: you should not drive, operate dangerous machinery, do any mentally demanding tasks or make any important legal or financial decisions while on narcotic pain medication and recovering from the general anesthetic.  °  °Call Your Doctor Immediately if You Have Any of the Following: °1. Bleeding that you cannot control with the above measures °2. Loss of vision, double vision, bulging of the eye or black eyes. °3. Fever over 101 degrees °4. Neck stiffness with  severe headache, fever, nausea and change in mental state. °You are always encourage to call anytime with concerns, however, please call with requests for pain medication refills during office hours. ° °Office Endoscopy: During follow-up visits your doctor will remove any packing or splints that may have been placed and evaluate and clean your sinuses endoscopically.  Topical anesthetic will be used to make this as comfortable as possible, though you may want to take your pain medication prior to the visit.  How often this will need to be done varies from patient to patient.  After complete recovery from the surgery, you may need follow-up endoscopy from time to time, particularly if there is concern of recurrent infection or nasal polyps. ° ° °General Anesthesia, Adult, Care After °These instructions provide you with information about caring for yourself after your procedure. Your health care provider may also give you more specific instructions. Your treatment has been planned according to current medical practices, but problems sometimes occur. Call your health care provider if you have any problems or questions after your procedure. °What can I expect after the procedure? °After the procedure, it is common to have: °· Vomiting. °· A sore throat. °· Mental slowness. ° °It is common to feel: °· Nauseous. °· Cold or shivery. °· Sleepy. °· Tired. °· Sore or achy, even in parts of your body where you did not have surgery. ° °Follow these instructions at home: °For at least 24 hours after the procedure: °· Do not: °? Participate in activities where you could fall or become injured. °? Drive. °? Use heavy machinery. °? Drink alcohol. °? Take sleeping pills or medicines that cause drowsiness. °? Make important decisions or sign legal documents. °? Take care of children on your own. °· Rest. °Eating and drinking °· If you vomit, drink water, juice, or soup when you can drink without vomiting. °· Drink enough fluid to  keep your urine clear or pale yellow. °· Make sure you have little or no nausea before eating solid foods. °· Follow the diet recommended by your health care provider. °General instructions °· Have a responsible adult stay with you until you are awake and alert. °· Return to your normal activities as told by your health care provider. Ask your health care provider what activities are safe for you. °· Take over-the-counter and prescription medicines only as told by your health care provider. °· If you smoke, do not smoke without supervision. °· Keep all follow-up visits as told by your health care provider. This is important. °Contact a health care provider if: °· You   continue to have nausea or vomiting at home, and medicines are not helpful. °· You cannot drink fluids or start eating again. °· You cannot urinate after 8-12 hours. °· You develop a skin rash. °· You have fever. °· You have increasing redness at the site of your procedure. °Get help right away if: °· You have difficulty breathing. °· You have chest pain. °· You have unexpected bleeding. °· You feel that you are having a life-threatening or urgent problem. °This information is not intended to replace advice given to you by your health care provider. Make sure you discuss any questions you have with your health care provider. °Document Released: 02/28/2001 Document Revised: 04/26/2016 Document Reviewed: 11/06/2015 °[employer] Interactive Patient Education © 2018 [employer] ° °

## 2018-10-05 ENCOUNTER — Ambulatory Visit: Payer: BC Managed Care – PPO | Admitting: Anesthesiology

## 2018-10-05 ENCOUNTER — Encounter
Admission: RE | Disposition: A | Discharge status: Home / Self Care | Payer: Self-pay | Source: Ambulatory Visit | Attending: Otolaryngology

## 2018-10-05 ENCOUNTER — Ambulatory Visit
Admission: RE | Admit: 2018-10-05 | Discharge: 2018-10-05 | Disposition: A | Discharge status: Home / Self Care | Payer: BC Managed Care – PPO | Source: Ambulatory Visit | Attending: Otolaryngology | Admitting: Otolaryngology

## 2018-10-05 DIAGNOSIS — Z888 Allergy status to other drugs, medicaments and biological substances status: Secondary | ICD-10-CM | POA: Insufficient documentation

## 2018-10-05 DIAGNOSIS — Z881 Allergy status to other antibiotic agents status: Secondary | ICD-10-CM | POA: Diagnosis not present

## 2018-10-05 DIAGNOSIS — Z882 Allergy status to sulfonamides status: Secondary | ICD-10-CM | POA: Diagnosis not present

## 2018-10-05 DIAGNOSIS — K219 Gastro-esophageal reflux disease without esophagitis: Secondary | ICD-10-CM | POA: Insufficient documentation

## 2018-10-05 DIAGNOSIS — J301 Allergic rhinitis due to pollen: Secondary | ICD-10-CM | POA: Diagnosis not present

## 2018-10-05 DIAGNOSIS — B49 Unspecified mycosis: Secondary | ICD-10-CM | POA: Diagnosis not present

## 2018-10-05 DIAGNOSIS — J338 Other polyp of sinus: Secondary | ICD-10-CM | POA: Insufficient documentation

## 2018-10-05 DIAGNOSIS — J324 Chronic pansinusitis: Secondary | ICD-10-CM | POA: Insufficient documentation

## 2018-10-05 DIAGNOSIS — H6983 Other specified disorders of Eustachian tube, bilateral: Secondary | ICD-10-CM | POA: Diagnosis not present

## 2018-10-05 DIAGNOSIS — Z79899 Other long term (current) drug therapy: Secondary | ICD-10-CM | POA: Insufficient documentation

## 2018-10-05 HISTORY — PX: FRONTAL SINUS EXPLORATION: SHX6591

## 2018-10-05 HISTORY — PX: ETHMOIDECTOMY: SHX5197

## 2018-10-05 HISTORY — PX: SPHENOIDECTOMY: SHX2421

## 2018-10-05 HISTORY — PX: IMAGE GUIDED SINUS SURGERY: SHX6570

## 2018-10-05 HISTORY — PX: MAXILLARY ANTROSTOMY: SHX2003

## 2018-10-05 SURGERY — SINUS SURGERY, WITH IMAGING GUIDANCE
Anesthesia: General | Site: Nose | Laterality: Bilateral

## 2018-10-05 MED ORDER — METOCLOPRAMIDE HCL 5 MG/ML IJ SOLN
INTRAMUSCULAR | Status: DC | PRN
  Administered 2018-10-05: 10 mg via INTRAVENOUS

## 2018-10-05 MED ORDER — ONDANSETRON HCL 4 MG/2ML IJ SOLN
INTRAMUSCULAR | Status: DC | PRN
  Administered 2018-10-05: 4 mg via INTRAVENOUS

## 2018-10-05 MED ORDER — ONDANSETRON HCL 4 MG/2ML IJ SOLN: 4.0000 mg | Freq: Once | INTRAMUSCULAR | Status: DC | PRN

## 2018-10-05 MED ORDER — OXYMETAZOLINE HCL 0.05 % NA SOLN
2.0000 | Freq: Once | NASAL | Status: AC
  Administered 2018-10-05: 2 via NASAL

## 2018-10-05 MED ORDER — PROMETHAZINE HCL 25 MG/ML IJ SOLN
INTRAMUSCULAR | Status: DC | PRN
  Administered 2018-10-05: 12.5 mg via INTRAVENOUS

## 2018-10-05 MED ORDER — LACTATED RINGERS IV SOLN: INTRAVENOUS | Status: DC

## 2018-10-05 MED ORDER — LACTATED RINGERS IV SOLN
INTRAVENOUS | Status: DC
  Administered 2018-10-05 (×2): via INTRAVENOUS

## 2018-10-05 MED ORDER — FENTANYL CITRATE (PF) 100 MCG/2ML IJ SOLN: 25.0000 ug | INTRAMUSCULAR | Status: DC | PRN

## 2018-10-05 MED ORDER — MIDAZOLAM HCL 5 MG/5ML IJ SOLN
INTRAMUSCULAR | Status: DC | PRN
  Administered 2018-10-05: 2 mg via INTRAVENOUS

## 2018-10-05 MED ORDER — SCOPOLAMINE 1 MG/3DAYS TD PT72
1.0000 | MEDICATED_PATCH | Freq: Once | TRANSDERMAL | Status: DC
  Administered 2018-10-05: 1.5 mg via TRANSDERMAL

## 2018-10-05 MED ORDER — OXYCODONE HCL 5 MG PO TABS: 5.0000 mg | ORAL_TABLET | Freq: Once | ORAL | Status: DC | PRN

## 2018-10-05 MED ORDER — DEXAMETHASONE SODIUM PHOSPHATE 4 MG/ML IJ SOLN
INTRAMUSCULAR | Status: DC | PRN
  Administered 2018-10-05: 12 mg via INTRAVENOUS

## 2018-10-05 MED ORDER — ROCURONIUM BROMIDE 100 MG/10ML IV SOLN
INTRAVENOUS | Status: DC | PRN
  Administered 2018-10-05: 25 mg via INTRAVENOUS

## 2018-10-05 MED ORDER — LIDOCAINE HCL (CARDIAC) PF 100 MG/5ML IV SOSY
PREFILLED_SYRINGE | INTRAVENOUS | Status: DC | PRN
  Administered 2018-10-05: 40 mg via INTRAVENOUS

## 2018-10-05 MED ORDER — PROPOFOL 10 MG/ML IV BOLUS
INTRAVENOUS | Status: DC | PRN
  Administered 2018-10-05: 130 mg via INTRAVENOUS

## 2018-10-05 MED ORDER — ACETAMINOPHEN 10 MG/ML IV SOLN
1000.0000 mg | Freq: Once | INTRAVENOUS | Status: AC
  Administered 2018-10-05: 1000 mg via INTRAVENOUS

## 2018-10-05 MED ORDER — GLYCOPYRROLATE 0.2 MG/ML IJ SOLN
INTRAMUSCULAR | Status: DC | PRN
  Administered 2018-10-05: 0.1 mg via INTRAVENOUS

## 2018-10-05 MED ORDER — FENTANYL CITRATE (PF) 100 MCG/2ML IJ SOLN
INTRAMUSCULAR | Status: DC | PRN
  Administered 2018-10-05: 100 ug via INTRAVENOUS
  Administered 2018-10-05 (×2): 25 ug via INTRAVENOUS
  Administered 2018-10-05: 12.5 ug via INTRAVENOUS

## 2018-10-05 MED ORDER — CLINDAMYCIN PHOSPHATE 600 MG/4ML IJ SOLN
600.0000 mg | Freq: Once | INTRAMUSCULAR | Status: AC
  Administered 2018-10-05: 600 mg via INTRAVENOUS

## 2018-10-05 MED ORDER — LIDOCAINE-EPINEPHRINE 1 %-1:100000 IJ SOLN
INTRAMUSCULAR | Status: DC | PRN
  Administered 2018-10-05: 6 mL

## 2018-10-05 MED ORDER — PHENYLEPHRINE HCL 0.5 % NA SOLN
NASAL | Status: DC | PRN
  Administered 2018-10-05: 15 mL via TOPICAL

## 2018-10-05 MED ORDER — OXYCODONE HCL 5 MG/5ML PO SOLN: 5.0000 mg | Freq: Once | ORAL | Status: DC | PRN

## 2018-10-05 SURGICAL SUPPLY — 33 items
BATTERY INSTRU NAVIGATION (MISCELLANEOUS) ×9 IMPLANT
CANISTER SUCT 1200ML W/VALVE (MISCELLANEOUS) ×3 IMPLANT
CATH IV 18X1 1/4 SAFELET (CATHETERS) ×3 IMPLANT
COAGULATOR SUCT 8FR VV (MISCELLANEOUS) ×3 IMPLANT
DRAPE HEAD BAR (DRAPES) ×3 IMPLANT
ELECT REM PT RETURN 9FT ADLT (ELECTROSURGICAL) ×3
ELECTRODE REM PT RTRN 9FT ADLT (ELECTROSURGICAL) ×1 IMPLANT
GLOVE PI ULTRA LF STRL 7.5 (GLOVE) ×2 IMPLANT
GLOVE PI ULTRA NON LATEX 7.5 (GLOVE) ×4
IMPL PROPEL CONTOUR (Prosthesis and Implant ENT) ×2 IMPLANT
IMPL PROPEL MINI SINUS (Prosthesis and Implant ENT) ×1 IMPLANT
IMPLANT PROPEL CONTOUR (Prosthesis and Implant ENT) ×6 IMPLANT
IMPLANT PROPEL MINI SINUS (Prosthesis and Implant ENT) ×3 IMPLANT
IV CATH 18X1 1/4 SAFELET (CATHETERS) ×1
IV NS 500ML (IV SOLUTION) ×2
IV NS 500ML BAXH (IV SOLUTION) ×1 IMPLANT
KIT TURNOVER KIT A (KITS) ×3 IMPLANT
NEEDLE ANESTHESIA  27G X 3.5 (NEEDLE) ×2
NEEDLE ANESTHESIA 27G X 3.5 (NEEDLE) ×1 IMPLANT
NEEDLE HYPO 27GX1-1/4 (NEEDLE) ×3 IMPLANT
NS IRRIG 500ML POUR BTL (IV SOLUTION) ×3 IMPLANT
PACK ENT CUSTOM (PACKS) ×3 IMPLANT
PACKING NASAL EPIS 4X2.4 XEROG (MISCELLANEOUS) ×9 IMPLANT
PATTIES SURGICAL .5 X3 (DISPOSABLE) ×3 IMPLANT
SHAVER DIEGO BLD STD TYPE A (BLADE) ×3 IMPLANT
SOL ANTI-FOG 6CC FOG-OUT (MISCELLANEOUS) ×2 IMPLANT
SOL FOG-OUT ANTI-FOG 6CC (MISCELLANEOUS) ×4
STRAP BODY AND KNEE 60X3 (MISCELLANEOUS) ×3 IMPLANT
SYR 3ML LL SCALE MARK (SYRINGE) ×3 IMPLANT
TOWEL OR 17X26 4PK STRL BLUE (TOWEL DISPOSABLE) ×3 IMPLANT
TRACKER CRANIALMASK (MASK) ×3 IMPLANT
TUBING DECLOG MULTIDEBRIDER (TUBING) ×3 IMPLANT
WATER STERILE IRR 250ML POUR (IV SOLUTION) ×3 IMPLANT

## 2018-10-05 NOTE — Op Note (Signed)
10/05/2018  1:28 PM    Hannah Robbins  [id number]   Pre-Op Dx: Chronic pansinusitis with nasal polyps  Post-op Dx: Same  Proc: Bilateral maxillary antrostomies with removal of tissue, bilateral total ethmoidectomy with sphenoidotomy, bilateral frontal sinusotomy revision, use of image guided system  Surg:  Hannah Robbins  Anes:  GOT  EBL: 100 mL  Comp: None  Findings: Polypoid changes with thick mucoid drainage in the frontal sinuses of both sides and small pockets in the ethmoid area.  There was thick mucus in the sphenoid sinus especially on the left side.  There was hard dry crust and fungus balls in the maxillary sinuses on both sides that had to be removed.  Procedure: The patient was brought to the operating room and placed in supine position.  She was given general anesthesia by oral endotracheal intubation.  The nose was prepped using 4% Xylocaine mixed with Afrin on cotton pledgets in the nose.  The image guided system was brought in and the CT scan was downloaded to the disc.  The template was applied to the face and was registered to the system at 0.5 mm of variance.  The suction instruments were then registered to the system and showed perfect alignment with the anatomy of the nose and the x-ray.  She was prepped and draped in a sterile fashion.  The cotton pledgets removed in the left side and there is thick mucoid drainage in the ethmoid sinus coming from the frontal sinuses and some from the posterior and sphenoid sinuses.  With the 30 degrees scope and see if fungus ball sitting in the left maxillary sinus.  The maxillary sinus was widened at its natural ostium and a Haller cell was opened up here to clear out the opening is much as possible this was done with 30 and 70 degrees scopes.  The fungus ball could not be grabbed and had to be flushed out.  I flushed it multiple times finally loosened upward, open L4 I could then suction it out and remove it.  There was polypoid  mucosa laterally and inferiorly and using frontal sinus instruments upside down I could open up these areas and cleaned this out as much as possible.  There is no further debris in the sinus and it appeared to be fairly clear.  The posterior ethmoid area was cleaned out of swollen polypoid disease.  The sphenoid sinus was opened further and the natural ostium was included.  The bone shards were removed and this was opened and flattened as much as possible to provide good access to this area.  The 0 and 30 degrees scopes were then used to visualize the middle and anterior ethmoid air cells.  There was some debris that was cleaned out to make sure that the sinuses were totally open.  There is polypoid mucosa here that was cleaned out until the skull base was completely smooth as possible.  The 30 and 70 degrees scopes were used to visualize the frontal sinus opening.  A frontal sinus Kerrison was used to widen this bony bridge and opening until there was about a 5 mm hole up into the tunnel sinus duct.  I could place the suction directly up into the frontal sinus that had easy access for drainage.  This completed widening of the frontal sinus duct and making sure every ethmoid air cell was opened in the area was cleared.  The posterior ethmoid and sphenoethmoid wall were open so the sphenoid was clearly opened  into the posterior nose.  Maxillary antrum was widened and the maxillary sinus was open as well.  This procedure was now repeated on the right side.  The polypoid changes of the posterior ethmoid were opened up and the sphenoid ethmoid wall was removed to have a good opening into the sphenoid sinus as well to make sure this was clear.  The maxillary antrum was widened and the sinus showed a fungus ball as well sitting posteriorly in the sinus.  Again this was so deep I could not reach it so it had to be flushed out to look flushed up into the anterior superior sinus and could then be suctioned out.  This  completed the maxillary sinus cleaning.  The 30 and 70 degrees scopes were used for open up some swollen tissue and inflammation around the middle and anterior ethmoid air cells.  Sinus duct was widened using the Kerrison again to make sure there is a good opening into the frontal sinus.  This widened the opening and allowed direct access up into the frontal sinus.  The sinuses were all now widely opened on the right side.  Cottonoid pledgets were placed her temporarily.  The left side was revisualize in all the airways were open.  A propel contour splint was placed at the frontal sinus opening.  This to help keep this open and heall well.  A propel mini stent was then placed into the ethmoid sinus with xerogel in the middle of it.  There is further xerogel was placed posteriorly.  The right side was then revisualized and again the sinuses were open and clear.  A propel contour stent was placed into the frontal sinus duct.  Xerogel was placed along the front of it and then into the sphenoid sinus opening as well.  No nasal splints were needed.  The airway was open and clear on both sides.  Patient tolerated the procedure well.  She was awakened and taken to the recovery room in satisfactory condition.  There were no operative complications.    Dispo:   To PACU to be discharged home  Plan: To follow-up in the office in 1 week.  She will start saline flushes tomorrow.  She will use a prednisone taper and an antibiotic flush in her nose instead of oral antibiotics to help keep this area clean.  Hannah Robbins  10/05/2018 1:28 PM

## 2018-10-05 NOTE — H&P (Signed)
H&P has been reviewed and patient reevaluated, no changes necessary. To be downloaded later.  

## 2018-10-05 NOTE — Anesthesia Procedure Notes (Signed)
Procedure Name: Intubation Date/Time: 10/05/2018 11:00 AM Performed by: Mayme Genta, CRNA Pre-anesthesia Checklist: Patient identified, Emergency Drugs available, Suction available, Patient being monitored and Timeout performed Patient Re-evaluated:Patient Re-evaluated prior to induction Oxygen Delivery Method: Circle system utilized Preoxygenation: Pre-oxygenation with 100% oxygen Induction Type: IV induction Ventilation: Mask ventilation without difficulty Laryngoscope Size: Miller and 2 Grade View: Grade I Tube type: Oral Rae Tube size: 7.0 mm Number of attempts: 1 Placement Confirmation: ETT inserted through vocal cords under direct vision,  positive ETCO2 and breath sounds checked- equal and bilateral Tube secured with: Tape Dental Injury: Teeth and Oropharynx as per pre-operative assessment

## 2018-10-05 NOTE — Transfer of Care (Signed)
Immediate Anesthesia Transfer of Care Note  Patient: Hannah Robbins  Procedure(s) Performed: IMAGE GUIDED SINUS SURGERY (Bilateral Nose) MAXILLARY ANTROSTOMY WITH TISSUE (Left Nose) FRONTAL SINUSUSOTOMY (Left Nose) TOTAL ETHMOIDECTOMY WITH FRONTAL EXPLORATION (Right Nose) SPHENOIDECTOMY WITH TOTAL ETHMOIDECTOMY (Left Nose)  Patient Location: PACU  Anesthesia Type: General  Level of Consciousness: awake, alert  and patient cooperative  Airway and Oxygen Therapy: Patient Spontanous Breathing and Patient connected to supplemental oxygen  Post-op Assessment: Post-op Vital signs reviewed, Patient's Cardiovascular Status Stable, Respiratory Function Stable, Patent Airway and No signs of Nausea or vomiting  Post-op Vital Signs: Reviewed and stable  Complications: No apparent anesthesia complications

## 2018-10-05 NOTE — Anesthesia Postprocedure Evaluation (Signed)
Anesthesia Post Note  Patient: Hannah Robbins  Procedure(s) Performed: IMAGE GUIDED SINUS SURGERY (Bilateral Nose) MAXILLARY ANTROSTOMY WITH TISSUE (Bilateral Nose) FRONTAL SINUSUSOTOMY (Bilateral Nose) TOTAL ETHMOIDECTOMY WITH FRONTAL EXPLORATION (Bilateral Nose) SPHENOIDECTOMY WITH TOTAL ETHMOIDECTOMY (Bilateral Nose)  Patient location during evaluation: PACU Anesthesia Type: General Level of consciousness: awake and alert Pain management: pain level controlled Vital Signs Assessment: post-procedure vital signs reviewed and stable Respiratory status: spontaneous breathing, nonlabored ventilation, respiratory function stable and patient connected to nasal cannula oxygen Cardiovascular status: blood pressure returned to baseline and stable Postop Assessment: no apparent nausea or vomiting Anesthetic complications: no    Alisa Graff

## 2018-10-05 NOTE — Anesthesia Preprocedure Evaluation (Signed)
Anesthesia Evaluation  Patient identified by MRN, date of birth, ID band Patient awake    Reviewed: Allergy & Precautions, H&P , NPO status , Patient's Chart, lab work & pertinent test results, reviewed documented beta blocker date and time   History of Anesthesia Complications (+) PONV and history of anesthetic complications  Airway Mallampati: II  TM Distance: >3 FB Neck ROM: full    Dental no notable dental hx.    Pulmonary neg pulmonary ROS,    Pulmonary exam normal breath sounds clear to auscultation       Cardiovascular Exercise Tolerance: Good  Rhythm:regular Rate:Normal  H/o palpitations, benign work-up 2017   Neuro/Psych  Headaches, negative psych ROS   GI/Hepatic Neg liver ROS, GERD  ,  Endo/Other  negative endocrine ROS  Renal/GU negative Renal ROS  negative genitourinary   Musculoskeletal   Abdominal   Peds  Hematology negative hematology ROS (+)   Anesthesia Other Findings   Reproductive/Obstetrics negative OB ROS                             Anesthesia Physical Anesthesia Plan  ASA: II  Anesthesia Plan: General   Post-op Pain Management:    Induction:   PONV Risk Score and Plan:   Airway Management Planned:   Additional Equipment:   Intra-op Plan:   Post-operative Plan:   Informed Consent: I have reviewed the patients History and Physical, chart, labs and discussed the procedure including the risks, benefits and alternatives for the proposed anesthesia with the patient or authorized representative who has indicated his/her understanding and acceptance.   Dental Advisory Given  Plan Discussed with: CRNA  Anesthesia Plan Comments:         Anesthesia Quick Evaluation

## 2018-10-09 LAB — SURGICAL PATHOLOGY

## 2019-01-01 ENCOUNTER — Other Ambulatory Visit: Payer: Self-pay | Admitting: Obstetrics and Gynecology

## 2019-01-01 DIAGNOSIS — Z1231 Encounter for screening mammogram for malignant neoplasm of breast: Secondary | ICD-10-CM

## 2019-01-04 ENCOUNTER — Ambulatory Visit: Payer: BC Managed Care – PPO | Admitting: Infectious Diseases

## 2019-02-02 ENCOUNTER — Ambulatory Visit
Admission: RE | Admit: 2019-02-02 | Discharge: 2019-02-02 | Disposition: A | Discharge status: Home / Self Care | Payer: BC Managed Care – PPO | Source: Ambulatory Visit | Attending: Obstetrics and Gynecology | Admitting: Obstetrics and Gynecology

## 2019-02-02 DIAGNOSIS — Z1231 Encounter for screening mammogram for malignant neoplasm of breast: Secondary | ICD-10-CM | POA: Diagnosis present

## 2019-11-22 ENCOUNTER — Other Ambulatory Visit
Admission: RE | Admit: 2019-11-22 | Discharge: 2019-11-22 | Disposition: A | Discharge status: Home / Self Care | Payer: BC Managed Care – PPO | Source: Ambulatory Visit | Attending: Otolaryngology | Admitting: Otolaryngology

## 2019-11-22 ENCOUNTER — Other Ambulatory Visit: Payer: Self-pay

## 2019-11-22 DIAGNOSIS — Z20828 Contact with and (suspected) exposure to other viral communicable diseases: Secondary | ICD-10-CM | POA: Insufficient documentation

## 2019-11-22 DIAGNOSIS — Z01812 Encounter for preprocedural laboratory examination: Secondary | ICD-10-CM | POA: Insufficient documentation

## 2019-11-22 LAB — SARS CORONAVIRUS 2 (TAT 6-24 HRS): SARS Coronavirus 2: NEGATIVE

## 2019-11-26 ENCOUNTER — Other Ambulatory Visit: Payer: Self-pay

## 2019-11-26 ENCOUNTER — Encounter: Payer: Self-pay | Admitting: Otolaryngology

## 2019-11-26 ENCOUNTER — Ambulatory Visit
Admission: RE | Admit: 2019-11-26 | Discharge: 2019-11-26 | Disposition: A | Discharge status: Home / Self Care | Payer: BC Managed Care – PPO | Attending: Otolaryngology | Admitting: Otolaryngology

## 2019-11-26 ENCOUNTER — Encounter
Admission: RE | Disposition: A | Discharge status: Home / Self Care | Payer: Self-pay | Source: Home / Self Care | Attending: Otolaryngology

## 2019-11-26 ENCOUNTER — Ambulatory Visit: Payer: BC Managed Care – PPO | Admitting: Certified Registered"

## 2019-11-26 DIAGNOSIS — H6593 Unspecified nonsuppurative otitis media, bilateral: Secondary | ICD-10-CM | POA: Insufficient documentation

## 2019-11-26 DIAGNOSIS — H6983 Other specified disorders of Eustachian tube, bilateral: Secondary | ICD-10-CM | POA: Insufficient documentation

## 2019-11-26 HISTORY — PX: NASOPHARYNGOSCOPY EUSTATION TUBE BALLOON DILATION: SHX6729

## 2019-11-26 HISTORY — PX: MYRINGOTOMY WITH TUBE PLACEMENT: SHX5663

## 2019-11-26 HISTORY — PX: REMOVAL OF EAR TUBE: SHX6057

## 2019-11-26 SURGERY — MYRINGOTOMY WITH TUBE PLACEMENT
Anesthesia: General | Laterality: Right

## 2019-11-26 MED ORDER — FAMOTIDINE 20 MG PO TABS: 20.0000 mg | ORAL_TABLET | Freq: Once | ORAL | Status: AC

## 2019-11-26 MED ORDER — PROPOFOL 10 MG/ML IV BOLUS
INTRAVENOUS | Status: AC
  Filled 2019-11-26: qty 20

## 2019-11-26 MED ORDER — PROPOFOL 10 MG/ML IV BOLUS
INTRAVENOUS | Status: DC | PRN
  Administered 2019-11-26: 160 mg via INTRAVENOUS

## 2019-11-26 MED ORDER — OXYMETAZOLINE HCL 0.05 % NA SOLN
NASAL | Status: AC
  Filled 2019-11-26: qty 30

## 2019-11-26 MED ORDER — SCOPOLAMINE 1 MG/3DAYS TD PT72
1.0000 | MEDICATED_PATCH | Freq: Once | TRANSDERMAL | Status: DC
  Administered 2019-11-26: 1.5 mg via TRANSDERMAL

## 2019-11-26 MED ORDER — DEXAMETHASONE SODIUM PHOSPHATE 10 MG/ML IJ SOLN
INTRAMUSCULAR | Status: AC
  Filled 2019-11-26: qty 1

## 2019-11-26 MED ORDER — PROMETHAZINE HCL 25 MG/ML IJ SOLN: 6.2500 mg | INTRAMUSCULAR | Status: DC | PRN

## 2019-11-26 MED ORDER — SCOPOLAMINE 1 MG/3DAYS TD PT72
MEDICATED_PATCH | TRANSDERMAL | Status: AC
  Filled 2019-11-26: qty 1

## 2019-11-26 MED ORDER — ONDANSETRON HCL 4 MG/2ML IJ SOLN
INTRAMUSCULAR | Status: AC
  Filled 2019-11-26: qty 2

## 2019-11-26 MED ORDER — LACTATED RINGERS IV SOLN: INTRAVENOUS | Status: DC | PRN

## 2019-11-26 MED ORDER — MIDAZOLAM HCL 2 MG/2ML IJ SOLN
INTRAMUSCULAR | Status: AC
  Filled 2019-11-26: qty 2

## 2019-11-26 MED ORDER — CIPROFLOXACIN-DEXAMETHASONE 0.3-0.1 % OT SUSP
OTIC | Status: AC
  Filled 2019-11-26: qty 7.5

## 2019-11-26 MED ORDER — MIDAZOLAM HCL 2 MG/2ML IJ SOLN
INTRAMUSCULAR | Status: DC | PRN
  Administered 2019-11-26: 2 mg via INTRAVENOUS

## 2019-11-26 MED ORDER — PHENYLEPHRINE HCL (PRESSORS) 10 MG/ML IV SOLN
INTRAVENOUS | Status: DC | PRN
  Administered 2019-11-26 (×3): 50 ug via INTRAVENOUS

## 2019-11-26 MED ORDER — LIDOCAINE HCL (CARDIAC) PF 100 MG/5ML IV SOSY
PREFILLED_SYRINGE | INTRAVENOUS | Status: DC | PRN
  Administered 2019-11-26: 60 mg via INTRAVENOUS

## 2019-11-26 MED ORDER — FENTANYL CITRATE (PF) 100 MCG/2ML IJ SOLN: 25.0000 ug | INTRAMUSCULAR | Status: DC | PRN

## 2019-11-26 MED ORDER — FENTANYL CITRATE (PF) 100 MCG/2ML IJ SOLN
INTRAMUSCULAR | Status: DC | PRN
  Administered 2019-11-26 (×2): 50 ug via INTRAVENOUS

## 2019-11-26 MED ORDER — CIPROFLOXACIN-DEXAMETHASONE 0.3-0.1 % OT SUSP
OTIC | Status: DC | PRN
  Administered 2019-11-26: 4 [drp] via OTIC

## 2019-11-26 MED ORDER — LACTATED RINGERS IV SOLN: INTRAVENOUS | Status: DC

## 2019-11-26 MED ORDER — FENTANYL CITRATE (PF) 100 MCG/2ML IJ SOLN
INTRAMUSCULAR | Status: AC
  Filled 2019-11-26: qty 2

## 2019-11-26 MED ORDER — DEXAMETHASONE SODIUM PHOSPHATE 10 MG/ML IJ SOLN
INTRAMUSCULAR | Status: DC | PRN
  Administered 2019-11-26: 10 mg via INTRAVENOUS

## 2019-11-26 MED ORDER — FAMOTIDINE 20 MG PO TABS
ORAL_TABLET | ORAL | Status: AC
  Administered 2019-11-26: 07:00:00 20 mg via ORAL
  Filled 2019-11-26: qty 1

## 2019-11-26 MED ORDER — SUCCINYLCHOLINE CHLORIDE 20 MG/ML IJ SOLN
INTRAMUSCULAR | Status: DC | PRN
  Administered 2019-11-26: 100 mg via INTRAVENOUS

## 2019-11-26 MED ORDER — PHENYLEPHRINE HCL (PRESSORS) 10 MG/ML IV SOLN
INTRAVENOUS | Status: AC
  Filled 2019-11-26: qty 1

## 2019-11-26 MED ORDER — SUCCINYLCHOLINE CHLORIDE 20 MG/ML IJ SOLN
INTRAMUSCULAR | Status: AC
  Filled 2019-11-26: qty 1

## 2019-11-26 MED ORDER — ONDANSETRON HCL 4 MG/2ML IJ SOLN
INTRAMUSCULAR | Status: DC | PRN
  Administered 2019-11-26: 4 mg via INTRAVENOUS

## 2019-11-26 MED ORDER — LIDOCAINE HCL (PF) 2 % IJ SOLN
INTRAMUSCULAR | Status: AC
  Filled 2019-11-26: qty 10

## 2019-11-26 SURGICAL SUPPLY — 19 items
BALLN CATH EUST TUBE 6X16 (BALLOONS) ×4
BLADE MYRINGOTOMY SICKLE W/HDL (BLADE) ×4 IMPLANT
CANISTER SUCT 1200ML W/VALVE (MISCELLANEOUS) ×4 IMPLANT
CATH BALLOON EUST TUBE 6X16 (BALLOONS) ×2 IMPLANT
COTTON BALL STRL MEDIUM (GAUZE/BANDAGES/DRESSINGS) ×4 IMPLANT
COVER WAND RF STERILE (DRAPES) ×4 IMPLANT
DEVICE INFLATION SEID (MISCELLANEOUS) ×4 IMPLANT
GLOVE PROTEXIS LATEX SZ 7.5 (GLOVE) ×12 IMPLANT
GOWN STRL REUS W/ TWL LRG LVL3 (GOWN DISPOSABLE) ×4 IMPLANT
GOWN STRL REUS W/TWL LRG LVL3 (GOWN DISPOSABLE) ×4
NS IRRIG 500ML POUR BTL (IV SOLUTION) ×4 IMPLANT
PACK HEAD/NECK (MISCELLANEOUS) ×4 IMPLANT
PATTIES SURGICAL .5 X3 (DISPOSABLE) ×4 IMPLANT
SOL ANTI-FOG 6CC FOG-OUT (MISCELLANEOUS) ×2 IMPLANT
SOL FOG-OUT ANTI-FOG 6CC (MISCELLANEOUS) ×2
TOWEL OR 17X26 4PK STRL BLUE (TOWEL DISPOSABLE) ×4 IMPLANT
TUBE EAR ARMSTRONG HC 1.14X3.5 (OTOLOGIC RELATED) ×8 IMPLANT
TUBING CONNECTING 10 (TUBING) ×3 IMPLANT
TUBING CONNECTING 10' (TUBING) ×1

## 2019-11-26 NOTE — Transfer of Care (Signed)
Immediate Anesthesia Transfer of Care Note  Patient: Hannah Robbins  Procedure(s) Performed: MYRINGOTOMY WITH TUBE PLACEMENT (Bilateral ) NASOPHARYNGOSCOPY EUSTATION TUBE BALLOON DILATION (Bilateral ) REMOVAL OF EAR TUBE (Right )  Patient Location: PACU  Anesthesia Type:General  Level of Consciousness: awake and drowsy  Airway & Oxygen Therapy: Patient Spontanous Breathing and Patient connected to face mask oxygen  Post-op Assessment: Report given to RN and Post -op Vital signs reviewed and stable  Post vital signs: Reviewed and stable  Last Vitals:  Vitals Value Taken Time  BP 107/49 11/26/19 0830  Temp 36.1 C 11/26/19 0830  Pulse 91 11/26/19 0836  Resp 15 11/26/19 0836  SpO2 100 % 11/26/19 0836  Vitals shown include unvalidated device data.  Last Pain:  Vitals:   11/26/19 0830  TempSrc:   PainSc: Asleep         Complications: No apparent anesthesia complications

## 2019-11-26 NOTE — Anesthesia Post-op Follow-up Note (Signed)
Anesthesia QCDR form completed.        

## 2019-11-26 NOTE — H&P (Signed)
H&P has been reviewed and patient reevaluated, no changes necessary. To be downloaded later.  

## 2019-11-26 NOTE — Anesthesia Preprocedure Evaluation (Addendum)
Anesthesia Evaluation  Patient identified by MRN, date of birth, ID band Patient awake    Reviewed: Allergy & Precautions, H&P , NPO status , Patient's Chart, lab work & pertinent test results, reviewed documented beta blocker date and time   History of Anesthesia Complications (+) PONV, PROLONGED EMERGENCE and history of anesthetic complications  Airway Mallampati: I  TM Distance: >3 FB Neck ROM: full    Dental  (+) Dental Advidsory Given, Teeth Intact   Pulmonary neg pulmonary ROS,    Pulmonary exam normal        Cardiovascular Exercise Tolerance: Good negative cardio ROS Normal cardiovascular exam     Neuro/Psych  Headaches, neg Seizures negative psych ROS   GI/Hepatic Neg liver ROS, GERD  ,  Endo/Other  negative endocrine ROS  Renal/GU negative Renal ROS  negative genitourinary   Musculoskeletal   Abdominal   Peds  Hematology negative hematology ROS (+)   Anesthesia Other Findings Past Medical History: No date: GERD (gastroesophageal reflux disease)     Comment:  NO MEDS No date: Headache No date: Palpitations     Comment:  PT SAW DR Nehemiah Massed IN 2017 AND STRESS TEST WAS WNL-HE               DID NOT REQUIRE HER TO F/U DUE TO BEING BENIGN -PT STILL               WILL HAVE PALPITATIONS BUT IS ASYMPTOMATIC No date: PONV (postoperative nausea and vomiting)     Comment:  also - slow to wake after surg 05/11/18   Reproductive/Obstetrics negative OB ROS                             Anesthesia Physical Anesthesia Plan  ASA: II  Anesthesia Plan: General   Post-op Pain Management:    Induction:   PONV Risk Score and Plan: 4 or greater and Ondansetron, Dexamethasone, Midazolam, Scopolamine patch - Pre-op, Promethazine and Treatment may vary due to age or medical condition  Airway Management Planned:   Additional Equipment:   Intra-op Plan:   Post-operative Plan:   Informed  Consent: I have reviewed the patients History and Physical, chart, labs and discussed the procedure including the risks, benefits and alternatives for the proposed anesthesia with the patient or authorized representative who has indicated his/her understanding and acceptance.     Dental Advisory Given  Plan Discussed with: Anesthesiologist, CRNA and Surgeon  Anesthesia Plan Comments:        Anesthesia Quick Evaluation

## 2019-11-26 NOTE — Op Note (Signed)
11/26/2019  8:21 AM    Hannah Robbins  [id number]   Pre-Op Dx: Chronic eustachian tube dysfunction, bilateral serous otitis media  Post-op Dx: Same  Proc:Bilateral myringotomy with tubes, nasopharyngoscopy, bilateral eustachian tube dilation  Surg: Huey Romans  Anes:  General orotracheal  EBL:  None  Comp: None  Findings: Purulence draining from the left frontal sinus.  Previous multiple sinus surgeries.  Retracted eardrums with little more scar tissue on the left side than the right.  Clear serous fluid suctioned bilaterally.  Procedure: With the patient in a comfortable supine position, general anesthesia was administered.  At an appropriate level, microscope and speculum were used to examine and clean the RIGHT ear canal.  The findings were as described above.  An anterior inferior radial myringotomy incision was sharply executed.  Middle ear contents were suctioned clear.  A PE tube was placed without difficulty.  Ciprodex otic solution was instilled into the external canal, and insufflated into the middle ear.  A cotton ball was placed at the external meatus. Hemostasis was observed.  This side was completed.  After completing the RIGHT side, the LEFT side was done in identical fashion.  A 0 and 30 degree scope were used to visualize the nose and nasopharynx both sides.  There is a lot of clear mucus in the front of the nose.  On the left side there is purulent drainage coming from the left frontal sinus.  There is no purulent drainage in any of the other sinuses.  The nose was suctioned clear.  The 30 degree scope was used to pass the eustachian tube balloon through the left nasal passage.  At the opening the eustachian tube the balloon catheter was passed up into the eustachian tube.  The balloon was then dilated to 12 cm of pressure and left in the eustachian tube dilated for 2 minutes.  The cuff was then deflated and the balloon was removed.  The scope was placed into the  right nostril done with the balloon dilator.  The dilator was passed into the eustachian tube deflated for its full length and then the cuff was inflated to 12 cm of pressure in a similar fashion as the other side.  The balloon was left inflated for 2 minutes and then was deflated and removed.  There is no sign of any bleeding on either side.  Following this  The patient was returned to anesthesia, awakened, and transferred to recovery in stable condition.  Dispo:  PACU to home  Plan: Routine drop use and water precautions.  Recheck my office in 2 to three weeks with an audiogram.   Huey Romans 8:21 AM 11/26/2019

## 2019-11-26 NOTE — Anesthesia Postprocedure Evaluation (Signed)
Anesthesia Post Note  Patient: Tinslee A Benge  Procedure(s) Performed: MYRINGOTOMY WITH TUBE PLACEMENT (Bilateral ) NASOPHARYNGOSCOPY EUSTATION TUBE BALLOON DILATION (Bilateral ) REMOVAL OF EAR TUBE (Right )  Patient location during evaluation: PACU Anesthesia Type: General Level of consciousness: awake and alert Pain management: pain level controlled Vital Signs Assessment: post-procedure vital signs reviewed and stable Respiratory status: spontaneous breathing, nonlabored ventilation, respiratory function stable and patient connected to nasal cannula oxygen Cardiovascular status: blood pressure returned to baseline and stable Postop Assessment: no apparent nausea or vomiting Anesthetic complications: no     Last Vitals:  Vitals:   11/26/19 0925 11/26/19 0936  BP:  114/63  Pulse:  64  Resp:  18  Temp: 36.5 C 36.4 C  SpO2:  100%    Last Pain:  Vitals:   11/26/19 0936  TempSrc: Temporal  PainSc: 2                  Martha Clan

## 2019-11-26 NOTE — Anesthesia Procedure Notes (Signed)
Procedure Name: Intubation Date/Time: 11/26/2019 7:43 AM Performed by: Jerrye Noble, CRNA Pre-anesthesia Checklist: Patient identified, Emergency Drugs available, Suction available and Patient being monitored Patient Re-evaluated:Patient Re-evaluated prior to induction Oxygen Delivery Method: Circle system utilized Preoxygenation: Pre-oxygenation with 100% oxygen Induction Type: IV induction Ventilation: Mask ventilation without difficulty Laryngoscope Size: Mac and 3 Grade View: Grade I Tube type: Oral Rae Tube size: 7.0 mm Number of attempts: 1 Placement Confirmation: ETT inserted through vocal cords under direct vision,  positive ETCO2 and breath sounds checked- equal and bilateral Tube secured with: Tape Dental Injury: Teeth and Oropharynx as per pre-operative assessment

## 2019-11-26 NOTE — Discharge Instructions (Signed)

## 2019-12-05 ENCOUNTER — Ambulatory Visit: Payer: BC Managed Care – PPO | Attending: Internal Medicine

## 2019-12-05 DIAGNOSIS — Z20822 Contact with and (suspected) exposure to covid-19: Secondary | ICD-10-CM

## 2019-12-06 LAB — NOVEL CORONAVIRUS, NAA: SARS-CoV-2, NAA: NOT DETECTED

## 2020-01-02 ENCOUNTER — Other Ambulatory Visit: Payer: Self-pay | Admitting: Obstetrics and Gynecology

## 2020-01-02 DIAGNOSIS — Z1231 Encounter for screening mammogram for malignant neoplasm of breast: Secondary | ICD-10-CM

## 2020-02-04 ENCOUNTER — Ambulatory Visit
Admission: RE | Admit: 2020-02-04 | Discharge: 2020-02-04 | Disposition: A | Discharge status: Home / Self Care | Payer: BC Managed Care – PPO | Source: Ambulatory Visit | Attending: Obstetrics and Gynecology | Admitting: Obstetrics and Gynecology

## 2020-02-04 DIAGNOSIS — Z1231 Encounter for screening mammogram for malignant neoplasm of breast: Secondary | ICD-10-CM | POA: Insufficient documentation

## 2020-02-08 ENCOUNTER — Other Ambulatory Visit: Payer: Self-pay | Admitting: Obstetrics and Gynecology

## 2020-02-08 DIAGNOSIS — R928 Other abnormal and inconclusive findings on diagnostic imaging of breast: Secondary | ICD-10-CM

## 2020-02-08 DIAGNOSIS — N6489 Other specified disorders of breast: Secondary | ICD-10-CM

## 2020-02-12 ENCOUNTER — Ambulatory Visit
Admission: RE | Admit: 2020-02-12 | Discharge: 2020-02-12 | Disposition: A | Discharge status: Home / Self Care | Payer: BC Managed Care – PPO | Source: Ambulatory Visit | Attending: Obstetrics and Gynecology | Admitting: Obstetrics and Gynecology

## 2020-02-12 DIAGNOSIS — N6489 Other specified disorders of breast: Secondary | ICD-10-CM

## 2020-02-12 DIAGNOSIS — R928 Other abnormal and inconclusive findings on diagnostic imaging of breast: Secondary | ICD-10-CM | POA: Diagnosis present

## 2020-02-18 ENCOUNTER — Other Ambulatory Visit: Payer: BC Managed Care – PPO

## 2020-02-18 ENCOUNTER — Ambulatory Visit: Payer: BC Managed Care – PPO

## 2021-02-06 ENCOUNTER — Other Ambulatory Visit: Payer: Self-pay | Admitting: Obstetrics and Gynecology

## 2021-02-06 DIAGNOSIS — Z1231 Encounter for screening mammogram for malignant neoplasm of breast: Secondary | ICD-10-CM

## 2021-02-25 ENCOUNTER — Ambulatory Visit
Admission: RE | Admit: 2021-02-25 | Discharge: 2021-02-25 | Disposition: A | Discharge status: Home / Self Care | Payer: BC Managed Care – PPO | Source: Ambulatory Visit | Attending: Obstetrics and Gynecology | Admitting: Obstetrics and Gynecology

## 2021-02-25 ENCOUNTER — Other Ambulatory Visit: Payer: Self-pay

## 2021-02-25 DIAGNOSIS — Z1231 Encounter for screening mammogram for malignant neoplasm of breast: Secondary | ICD-10-CM | POA: Insufficient documentation

## 2021-04-21 ENCOUNTER — Encounter: Payer: Self-pay | Admitting: Otolaryngology

## 2021-04-23 NOTE — Discharge Instructions (Signed)
[facility] °DISCHARGE INSTRUCTIONS FOR MYRINGOTOMY AND TUBE INSERTION ° °[facility] °PAUL JUENGEL, M.D. ° °Diet:   After surgery, the patient should take only liquids and foods as tolerated.  The patient may then have a regular diet after the effects of anesthesia have worn off, usually about four to six hours after surgery. ° °Activities:   The patient should rest until the effects of anesthesia have worn off.  After this, there are no restrictions on the normal daily activities. ° °Medications:   You will be given antibiotic drops to be used in the ears postoperatively.  It is recommended to use 3 drops 3 times a day for 3 days, then the drops should be saved for possible future use. ° °The tubes should not cause any discomfort to the patient, but if there is any question, Tylenol should be given according to the instructions for the age of the patient. ° °Other medications should be continued normally. ° °Precautions:   Should there be recurrent drainage after the tubes are placed, the drops should be used for approximately 3-4 days.  If it does not clear, you should call the ENT office. ° °Earplugs:   Earplugs are only needed for those who are going to be submerged under water.  When taking a bath or shower and using a cup or showerhead to rinse hair, it is not necessary to wear earplugs.  These come in a variety of fashions, all of which can be obtained at our office.  However, if one is not able to come by the office, then silicone plugs can be found at most pharmacies.  It is not advised to stick anything in the ear that is not approved as an earplug.  Silly putty is not to be used as an earplug.  Swimming is allowed in patients after ear tubes are inserted, however, they must wear earplugs if they are going to be submerged under water.  For those children who are going to be swimming a lot, it is recommended to use a fitted ear mold, which can be made by our audiologist.   If discharge is noticed from the ears, this most likely represents an ear infection.  We would recommend getting your eardrops and using them as indicated above.  If it does not clear, then you should call the ENT office.  For follow up, the patient should return to the ENT office three weeks postoperatively and then every six months as required by the doctor. ° ° °General Anesthesia, Adult, Care After °This sheet gives you information about how to care for yourself after your procedure. Your health care provider may also give you more specific instructions. If you have problems or questions, contact your health care provider. °What can I expect after the procedure? °After the procedure, the following side effects are common: °· Pain or discomfort at the IV site. °· Nausea. °· Vomiting. °· Sore throat. °· Trouble concentrating. °· Feeling cold or chills. °· Feeling weak or tired. °· Sleepiness and fatigue. °· Soreness and body aches. These side effects can affect parts of the body that were not involved in surgery. °Follow these instructions at home: °For the time period you were told by your health care provider: °· Rest. °· Do not participate in activities where you could fall or become injured. °· Do not drive or use machinery. °· Do not drink alcohol. °· Do not take sleeping pills or medicines that cause drowsiness. °· Do   not make important decisions or sign legal documents. °· Do not take care of children on your own.   °Eating and drinking °· Follow any instructions from your health care provider about eating or drinking restrictions. °· When you feel hungry, start by eating small amounts of foods that are soft and easy to digest (bland), such as toast. Gradually return to your regular diet. °· Drink enough fluid to keep your urine pale yellow. °· If you vomit, rehydrate by drinking water, juice, or clear broth. °General instructions °· If you have sleep apnea, surgery and certain medicines can increase your  risk for breathing problems. Follow instructions from your health care provider about wearing your sleep device: °? Anytime you are sleeping, including during daytime naps. °? While taking prescription pain medicines, sleeping medicines, or medicines that make you drowsy. °· Have a responsible adult stay with you for the time you are told. It is important to have someone help care for you until you are awake and alert. °· Return to your normal activities as told by your health care provider. Ask your health care provider what activities are safe for you. °· Take over-the-counter and prescription medicines only as told by your health care provider. °· If you smoke, do not smoke without supervision. °· Keep all follow-up visits as told by your health care provider. This is important. °Contact a health care provider if: °· You have nausea or vomiting that does not get better with medicine. °· You cannot eat or drink without vomiting. °· You have pain that does not get better with medicine. °· You are unable to pass urine. °· You develop a skin rash. °· You have a fever. °· You have redness around your IV site that gets worse. °Get help right away if: °· You have difficulty breathing. °· You have chest pain. °· You have blood in your urine or stool, or you vomit blood. °Summary °· After the procedure, it is common to have a sore throat or nausea. It is also common to feel tired. °· Have a responsible adult stay with you for the time you are told. It is important to have someone help care for you until you are awake and alert. °· When you feel hungry, start by eating small amounts of foods that are soft and easy to digest (bland), such as toast. Gradually return to your regular diet. °· Drink enough fluid to keep your urine pale yellow. °· Return to your normal activities as told by your health care provider. Ask your health care provider what activities are safe for you. °This information is not intended to replace  advice given to you by your health care provider. Make sure you discuss any questions you have with your health care provider. °Document Revised: 08/07/2020 Document Reviewed: 03/06/2020 °Elsevier Patient Education © 2021 [employer] ° °

## 2021-04-30 ENCOUNTER — Encounter: Payer: Self-pay | Admitting: Otolaryngology

## 2021-04-30 ENCOUNTER — Ambulatory Visit: Payer: BC Managed Care – PPO | Admitting: Anesthesiology

## 2021-04-30 ENCOUNTER — Other Ambulatory Visit: Payer: Self-pay

## 2021-04-30 ENCOUNTER — Encounter
Admission: RE | Disposition: A | Discharge status: Home / Self Care | Payer: Self-pay | Source: Home / Self Care | Attending: Otolaryngology

## 2021-04-30 ENCOUNTER — Ambulatory Visit
Admission: RE | Admit: 2021-04-30 | Discharge: 2021-04-30 | Disposition: A | Discharge status: Home / Self Care | Payer: BC Managed Care – PPO | Attending: Otolaryngology | Admitting: Otolaryngology

## 2021-04-30 DIAGNOSIS — Z7951 Long term (current) use of inhaled steroids: Secondary | ICD-10-CM | POA: Diagnosis not present

## 2021-04-30 DIAGNOSIS — H6993 Unspecified Eustachian tube disorder, bilateral: Secondary | ICD-10-CM | POA: Diagnosis not present

## 2021-04-30 DIAGNOSIS — Z882 Allergy status to sulfonamides status: Secondary | ICD-10-CM | POA: Insufficient documentation

## 2021-04-30 DIAGNOSIS — H6523 Chronic serous otitis media, bilateral: Secondary | ICD-10-CM | POA: Insufficient documentation

## 2021-04-30 DIAGNOSIS — Z79899 Other long term (current) drug therapy: Secondary | ICD-10-CM | POA: Insufficient documentation

## 2021-04-30 DIAGNOSIS — Z881 Allergy status to other antibiotic agents status: Secondary | ICD-10-CM | POA: Insufficient documentation

## 2021-04-30 HISTORY — DX: Family history of other specified conditions: Z84.89

## 2021-04-30 HISTORY — DX: Attention-deficit hyperactivity disorder, predominantly inattentive type: F90.0

## 2021-04-30 HISTORY — PX: MYRINGOTOMY WITH TUBE PLACEMENT: SHX5663

## 2021-04-30 SURGERY — MYRINGOTOMY WITH TUBE PLACEMENT
Anesthesia: General | Site: Ear | Laterality: Bilateral

## 2021-04-30 MED ORDER — CIPROFLOXACIN-DEXAMETHASONE 0.3-0.1 % OT SUSP
OTIC | Status: DC | PRN
  Administered 2021-04-30: 1 [drp] via OTIC

## 2021-04-30 MED ORDER — MEPERIDINE HCL 25 MG/ML IJ SOLN: 6.2500 mg | INTRAMUSCULAR | Status: DC | PRN

## 2021-04-30 MED ORDER — GLYCOPYRROLATE 0.2 MG/ML IJ SOLN
INTRAMUSCULAR | Status: DC | PRN
  Administered 2021-04-30: .1 mg via INTRAVENOUS

## 2021-04-30 MED ORDER — PROPOFOL 10 MG/ML IV BOLUS
INTRAVENOUS | Status: DC | PRN
  Administered 2021-04-30: 100 mg via INTRAVENOUS

## 2021-04-30 MED ORDER — HYDROMORPHONE HCL 1 MG/ML IJ SOLN: 0.2500 mg | INTRAMUSCULAR | Status: DC | PRN

## 2021-04-30 MED ORDER — OXYCODONE HCL 5 MG/5ML PO SOLN: 5.0000 mg | Freq: Once | ORAL | Status: AC | PRN

## 2021-04-30 MED ORDER — OXYCODONE HCL 5 MG PO TABS
5.0000 mg | ORAL_TABLET | Freq: Once | ORAL | Status: AC | PRN
Start: 2021-04-30 — End: 2021-04-30
  Administered 2021-04-30: 5 mg via ORAL

## 2021-04-30 MED ORDER — MIDAZOLAM HCL 5 MG/5ML IJ SOLN
INTRAMUSCULAR | Status: DC | PRN
  Administered 2021-04-30: 2 mg via INTRAVENOUS

## 2021-04-30 MED ORDER — LIDOCAINE HCL (CARDIAC) PF 100 MG/5ML IV SOSY
PREFILLED_SYRINGE | INTRAVENOUS | Status: DC | PRN
  Administered 2021-04-30: 50 mg via INTRAVENOUS

## 2021-04-30 MED ORDER — ONDANSETRON HCL 4 MG/2ML IJ SOLN
INTRAMUSCULAR | Status: DC | PRN
  Administered 2021-04-30: 4 mg via INTRAVENOUS

## 2021-04-30 MED ORDER — CIPROFLOXACIN-DEXAMETHASONE 0.3-0.1 % OT SUSP: 4.0000 [drp] | Freq: Three times a day (TID) | OTIC | 0 refills | Status: AC

## 2021-04-30 MED ORDER — LACTATED RINGERS IV SOLN: INTRAVENOUS | Status: DC

## 2021-04-30 MED ORDER — PROMETHAZINE HCL 25 MG/ML IJ SOLN: 6.2500 mg | INTRAMUSCULAR | Status: DC | PRN

## 2021-04-30 SURGICAL SUPPLY — 9 items
BALL CTTN LRG ABS STRL LF (GAUZE/BANDAGES/DRESSINGS) ×1
BLADE MYR LANCE NRW W/HDL (BLADE) ×2 IMPLANT
CANISTER SUCT 1200ML W/VALVE (MISCELLANEOUS) ×2 IMPLANT
COTTONBALL LRG STERILE PKG (GAUZE/BANDAGES/DRESSINGS) ×2 IMPLANT
STRAP BODY AND KNEE 60X3 (MISCELLANEOUS) ×2 IMPLANT
TOWEL OR 17X26 4PK STRL BLUE (TOWEL DISPOSABLE) ×2 IMPLANT
TUBE EAR T 1.27X5.3 BFLY (OTOLOGIC RELATED) ×2 IMPLANT
TUBING CONN 6MMX3.1M (TUBING) ×1
TUBING SUCTION CONN 0.25 STRL (TUBING) ×1 IMPLANT

## 2021-04-30 NOTE — Anesthesia Preprocedure Evaluation (Signed)
Anesthesia Evaluation  Patient identified by MRN, date of birth, ID band Patient awake    Reviewed: Allergy & Precautions, NPO status , Patient's Chart, lab work & pertinent test results, reviewed documented beta blocker date and time   History of Anesthesia Complications (+) PONV and history of anesthetic complications  Airway Mallampati: II  TM Distance: >3 FB Neck ROM: Full    Dental   Pulmonary    breath sounds clear to auscultation       Cardiovascular (-) angina(-) DOE  Rhythm:Regular Rate:Normal     Neuro/Psych  Headaches, PSYCHIATRIC DISORDERS    GI/Hepatic GERD  Controlled,  Endo/Other    Renal/GU      Musculoskeletal   Abdominal   Peds  Hematology   Anesthesia Other Findings   Reproductive/Obstetrics                             Anesthesia Physical Anesthesia Plan  ASA: II  Anesthesia Plan: General   Post-op Pain Management:    Induction: Intravenous  PONV Risk Score and Plan: 4 or greater and Propofol infusion, TIVA, Treatment may vary due to age or medical condition, Ondansetron and Dexamethasone  Airway Management Planned: Natural Airway and Nasal Cannula  Additional Equipment:   Intra-op Plan:   Post-operative Plan:   Informed Consent: I have reviewed the patients History and Physical, chart, labs and discussed the procedure including the risks, benefits and alternatives for the proposed anesthesia with the patient or authorized representative who has indicated his/her understanding and acceptance.       Plan Discussed with: CRNA and Anesthesiologist  Anesthesia Plan Comments:         Anesthesia Quick Evaluation

## 2021-04-30 NOTE — Transfer of Care (Signed)
Immediate Anesthesia Transfer of Care Note  Patient: Hannah Robbins  Procedure(s) Performed: MYRINGOTOMY WITH TUBE PLACEMENT BUTTERFLY (Bilateral Ear)  Patient Location: PACU  Anesthesia Type: General  Level of Consciousness: awake, alert  and patient cooperative  Airway and Oxygen Therapy: Patient Spontanous Breathing and Patient connected to supplemental oxygen  Post-op Assessment: Post-op Vital signs reviewed, Patient's Cardiovascular Status Stable, Respiratory Function Stable, Patent Airway and No signs of Nausea or vomiting  Post-op Vital Signs: Reviewed and stable  Complications: No complications documented.

## 2021-04-30 NOTE — H&P (Signed)
H&P has been reviewed and patient reevaluated, no changes necessary. To be downloaded later.  

## 2021-04-30 NOTE — Op Note (Signed)
04/30/2021  8:17 AM    Hannah Robbins  [id number]   Pre-Op Dx: Verdie Drown tube dysfunction and chronic serous otitis media  Post-op Dx: Same  Proc:Bilateral myringotomy with butterfly tubes  Surg: Huey Romans  Anes:  General by mask  EBL:  None  Comp: None  Findings: Right eardrum was retracted with serous fluid filling the middle ear space except for 1 bubble.  The left middle ear did not have much fluid at all.  Butterfly tubes were placed on both sides  Procedure: With the patient in a comfortable supine position, general mask anesthesia was administered.  At an appropriate level, microscope and speculum were used to examine and clean the RIGHT ear canal.  The findings were as described above.  An anterior inferior radial myringotomy incision was sharply executed.  Middle ear contents were suctioned clear.  A PE tube was placed without difficulty.  Ciprodex otic solution was instilled into the external canal, and insufflated into the middle ear.  A cotton ball was placed at the external meatus. Hemostasis was observed.  This side was completed.  After completing the RIGHT side, the LEFT side was done in identical fashion.    Following this  The patient was returned to anesthesia, awakened, and transferred to recovery in stable condition.  Dispo:  PACU to home  Plan: Routine drop use and water precautions.  Recheck my office three weeks.   Hannah Robbins 8:17 AM 04/30/2021

## 2021-04-30 NOTE — Anesthesia Postprocedure Evaluation (Signed)
Anesthesia Post Note  Patient: Hannah Robbins  Procedure(s) Performed: MYRINGOTOMY WITH TUBE PLACEMENT BUTTERFLY (Bilateral Ear)     Patient location during evaluation: PACU Anesthesia Type: General Level of consciousness: awake and alert Pain management: pain level controlled Vital Signs Assessment: post-procedure vital signs reviewed and stable Respiratory status: spontaneous breathing, nonlabored ventilation, respiratory function stable and patient connected to nasal cannula oxygen Cardiovascular status: blood pressure returned to baseline and stable Postop Assessment: no apparent nausea or vomiting Anesthetic complications: no   No complications documented.  Kahlan Engebretson A  Wylodean Shimmel

## 2021-04-30 NOTE — Anesthesia Procedure Notes (Signed)
Procedure Name: General with mask airway Performed by: Corinda Ammon, CRNA Pre-anesthesia Checklist: Patient identified, Patient being monitored, Emergency Drugs available, Timeout performed and Suction available Patient Re-evaluated:Patient Re-evaluated prior to induction Oxygen Delivery Method: Circle system utilized Preoxygenation: Pre-oxygenation with 100% oxygen Induction Type: Combination inhalational/ intravenous induction Ventilation: Mask ventilation without difficulty Dental Injury: Teeth and Oropharynx as per pre-operative assessment        

## 2021-05-01 ENCOUNTER — Encounter: Payer: Self-pay | Admitting: Otolaryngology

## 2022-02-01 ENCOUNTER — Other Ambulatory Visit: Payer: Self-pay | Admitting: Obstetrics and Gynecology

## 2022-02-01 DIAGNOSIS — Z1231 Encounter for screening mammogram for malignant neoplasm of breast: Secondary | ICD-10-CM

## 2022-03-09 ENCOUNTER — Ambulatory Visit
Admission: RE | Admit: 2022-03-09 | Discharge: 2022-03-09 | Disposition: A | Discharge status: Home / Self Care | Payer: BC Managed Care – PPO | Source: Ambulatory Visit | Attending: Obstetrics and Gynecology | Admitting: Obstetrics and Gynecology

## 2022-03-09 DIAGNOSIS — Z1231 Encounter for screening mammogram for malignant neoplasm of breast: Secondary | ICD-10-CM | POA: Insufficient documentation

## 2023-02-18 ENCOUNTER — Other Ambulatory Visit: Payer: Self-pay | Admitting: Obstetrics and Gynecology

## 2023-02-18 DIAGNOSIS — Z1231 Encounter for screening mammogram for malignant neoplasm of breast: Secondary | ICD-10-CM

## 2023-03-14 ENCOUNTER — Ambulatory Visit
Admission: RE | Admit: 2023-03-14 | Discharge: 2023-03-14 | Disposition: A | Discharge status: Home / Self Care | Payer: BC Managed Care – PPO | Source: Ambulatory Visit | Attending: Obstetrics and Gynecology | Admitting: Obstetrics and Gynecology

## 2023-03-14 DIAGNOSIS — Z1231 Encounter for screening mammogram for malignant neoplasm of breast: Secondary | ICD-10-CM | POA: Insufficient documentation

## 2024-02-07 ENCOUNTER — Other Ambulatory Visit: Payer: Self-pay | Admitting: Obstetrics and Gynecology

## 2024-02-07 DIAGNOSIS — Z1231 Encounter for screening mammogram for malignant neoplasm of breast: Secondary | ICD-10-CM

## 2024-03-15 ENCOUNTER — Ambulatory Visit
Admission: RE | Admit: 2024-03-15 | Discharge: 2024-03-15 | Disposition: A | Discharge status: Home / Self Care | Payer: Self-pay | Source: Ambulatory Visit | Attending: Obstetrics and Gynecology | Admitting: Obstetrics and Gynecology

## 2024-03-15 DIAGNOSIS — Z1231 Encounter for screening mammogram for malignant neoplasm of breast: Secondary | ICD-10-CM | POA: Diagnosis present

## 2024-03-22 ENCOUNTER — Other Ambulatory Visit: Payer: Self-pay | Admitting: Obstetrics and Gynecology

## 2024-03-22 DIAGNOSIS — R928 Other abnormal and inconclusive findings on diagnostic imaging of breast: Secondary | ICD-10-CM

## 2024-03-26 ENCOUNTER — Ambulatory Visit
Admission: RE | Admit: 2024-03-26 | Discharge: 2024-03-26 | Disposition: A | Discharge status: Home / Self Care | Source: Ambulatory Visit | Attending: Obstetrics and Gynecology | Admitting: Obstetrics and Gynecology

## 2024-03-26 DIAGNOSIS — R928 Other abnormal and inconclusive findings on diagnostic imaging of breast: Secondary | ICD-10-CM | POA: Diagnosis present

## 2024-03-28 ENCOUNTER — Other Ambulatory Visit: Payer: Self-pay | Admitting: Obstetrics and Gynecology

## 2024-03-28 DIAGNOSIS — R928 Other abnormal and inconclusive findings on diagnostic imaging of breast: Secondary | ICD-10-CM

## 2024-03-30 ENCOUNTER — Ambulatory Visit
Admission: RE | Admit: 2024-03-30 | Discharge: 2024-03-30 | Disposition: A | Discharge status: Home / Self Care | Source: Ambulatory Visit | Attending: Obstetrics and Gynecology | Admitting: Obstetrics and Gynecology

## 2024-03-30 DIAGNOSIS — N6002 Solitary cyst of left breast: Secondary | ICD-10-CM | POA: Diagnosis present

## 2024-03-30 DIAGNOSIS — R928 Other abnormal and inconclusive findings on diagnostic imaging of breast: Secondary | ICD-10-CM | POA: Insufficient documentation

## 2024-03-30 HISTORY — PX: BREAST BIOPSY: SHX20

## 2024-03-30 MED ORDER — LIDOCAINE 1 % OPTIME INJ - NO CHARGE
5.0000 mL | Freq: Once | INTRAMUSCULAR | Status: AC
  Administered 2024-03-30: 5 mL
  Filled 2024-03-30: qty 6

## 2024-03-30 MED ORDER — LIDOCAINE-EPINEPHRINE 1 %-1:100000 IJ SOLN
10.0000 mL | Freq: Once | INTRAMUSCULAR | Status: AC
  Administered 2024-03-30: 10 mL
  Filled 2024-03-30: qty 10

## 2024-04-02 LAB — SURGICAL PATHOLOGY

## 2024-07-19 ENCOUNTER — Other Ambulatory Visit: Payer: Self-pay | Admitting: Otolaryngology

## 2024-07-19 DIAGNOSIS — H90A21 Sensorineural hearing loss, unilateral, right ear, with restricted hearing on the contralateral side: Secondary | ICD-10-CM

## 2024-07-24 ENCOUNTER — Ambulatory Visit
Admission: RE | Admit: 2024-07-24 | Discharge: 2024-07-24 | Disposition: A | Discharge status: Home / Self Care | Source: Ambulatory Visit | Attending: Otolaryngology | Admitting: Otolaryngology

## 2024-07-24 DIAGNOSIS — H90A21 Sensorineural hearing loss, unilateral, right ear, with restricted hearing on the contralateral side: Secondary | ICD-10-CM

## 2024-07-26 ENCOUNTER — Ambulatory Visit
Admission: RE | Admit: 2024-07-26 | Discharge: 2024-07-26 | Disposition: A | Discharge status: Home / Self Care | Source: Ambulatory Visit | Attending: Otolaryngology | Admitting: Otolaryngology

## 2024-07-26 MED ORDER — GADOPICLENOL 0.5 MMOL/ML IV SOLN
10.0000 mL | Freq: Once | INTRAVENOUS | Status: AC | PRN
  Administered 2024-07-26: 9 mL via INTRAVENOUS
